# Patient Record
Sex: Female | Born: 1965
Health system: Southern US, Community
[De-identification: ages and names within clinical notes are randomized; demographics above are authoritative.]

---

## 2019-11-06 ENCOUNTER — Other Ambulatory Visit (HOSPITAL_COMMUNITY): Payer: Medicare Other

## 2019-11-06 ENCOUNTER — Inpatient Hospital Stay
Admission: RE | Admit: 2019-11-06 | Discharge: 2019-12-04 | Disposition: A | Discharge status: Rehabilitation Facility | Payer: Medicare Other | Attending: Internal Medicine | Admitting: Internal Medicine

## 2019-11-06 DIAGNOSIS — J45909 Unspecified asthma, uncomplicated: Secondary | ICD-10-CM

## 2019-11-06 DIAGNOSIS — J9690 Respiratory failure, unspecified, unspecified whether with hypoxia or hypercapnia: Secondary | ICD-10-CM

## 2019-11-07 LAB — COMPREHENSIVE METABOLIC PANEL
ALT: 22 U/L (ref 0–44)
AST: 17 U/L (ref 15–41)
Albumin: 2.2 g/dL — ABNORMAL LOW (ref 3.5–5.0)
Alkaline Phosphatase: 58 U/L (ref 38–126)
Anion gap: 12 (ref 5–15)
BUN: 22 mg/dL — ABNORMAL HIGH (ref 6–20)
CO2: 27 mmol/L (ref 22–32)
Calcium: 8.9 mg/dL (ref 8.9–10.3)
Chloride: 105 mmol/L (ref 98–111)
Creatinine, Ser: 0.68 mg/dL (ref 0.44–1.00)
GFR calc Af Amer: >60 mL/min (ref >60)
GFR calc non Af Amer: >60 mL/min (ref >60)
Glucose, Bld: 82 mg/dL (ref 70–99)
Potassium: 4.4 mmol/L (ref 3.5–5.1)
Sodium: 144 mmol/L (ref 135–145)
Total Bilirubin: 0.5 mg/dL (ref 0.3–1.2)
Total Protein: 5.6 g/dL — ABNORMAL LOW (ref 6.5–8.1)

## 2019-11-07 LAB — CBC
HCT: 37.7 % (ref 36.0–46.0)
Hemoglobin: 11.6 g/dL — ABNORMAL LOW (ref 12.0–15.0)
MCH: 27 pg (ref 26.0–34.0)
MCHC: 30.8 g/dL (ref 30.0–36.0)
MCV: 87.9 fL (ref 80.0–100.0)
Platelets: 202 10*3/uL (ref 150–400)
RBC: 4.29 MIL/uL (ref 3.87–5.11)
RDW: 14.2 % (ref 11.5–15.5)
WBC: 13.2 10*3/uL — ABNORMAL HIGH (ref 4.0–10.5)
nRBC: 0.2 % (ref 0.0–0.2)

## 2019-11-07 LAB — URINALYSIS, ROUTINE W REFLEX MICROSCOPIC
Bilirubin Urine: NEGATIVE
Glucose, UA: NEGATIVE mg/dL
Ketones, ur: NEGATIVE mg/dL
Leukocytes,Ua: NEGATIVE
Nitrite: POSITIVE — AB
Protein, ur: NEGATIVE mg/dL
Specific Gravity, Urine: 1.021 (ref 1.005–1.030)
pH: 6 (ref 5.0–8.0)

## 2019-11-07 LAB — TSH: TSH: 0.488 u[IU]/mL (ref 0.350–4.500)

## 2019-11-10 ENCOUNTER — Other Ambulatory Visit (HOSPITAL_COMMUNITY): Payer: Medicare Other

## 2019-11-11 LAB — BASIC METABOLIC PANEL
Anion gap: 9 (ref 5–15)
BUN: 16 mg/dL (ref 6–20)
CO2: 31 mmol/L (ref 22–32)
Calcium: 8.7 mg/dL — ABNORMAL LOW (ref 8.9–10.3)
Chloride: 103 mmol/L (ref 98–111)
Creatinine, Ser: 0.61 mg/dL (ref 0.44–1.00)
GFR calc Af Amer: >60 mL/min (ref >60)
GFR calc non Af Amer: >60 mL/min (ref >60)
Glucose, Bld: 150 mg/dL — ABNORMAL HIGH (ref 70–99)
Potassium: 4.3 mmol/L (ref 3.5–5.1)
Sodium: 143 mmol/L (ref 135–145)

## 2019-11-11 LAB — CBC
HCT: 37.9 % (ref 36.0–46.0)
Hemoglobin: 11.8 g/dL — ABNORMAL LOW (ref 12.0–15.0)
MCH: 27.3 pg (ref 26.0–34.0)
MCHC: 31.1 g/dL (ref 30.0–36.0)
MCV: 87.7 fL (ref 80.0–100.0)
Platelets: 245 10*3/uL (ref 150–400)
RBC: 4.32 MIL/uL (ref 3.87–5.11)
RDW: 14.8 % (ref 11.5–15.5)
WBC: 14.6 10*3/uL — ABNORMAL HIGH (ref 4.0–10.5)
nRBC: 0 % (ref 0.0–0.2)

## 2019-11-14 LAB — CBC
HCT: 40.6 % (ref 36.0–46.0)
Hemoglobin: 12.8 g/dL (ref 12.0–15.0)
MCH: 28.3 pg (ref 26.0–34.0)
MCHC: 31.5 g/dL (ref 30.0–36.0)
MCV: 89.6 fL (ref 80.0–100.0)
Platelets: 253 10*3/uL (ref 150–400)
RBC: 4.53 MIL/uL (ref 3.87–5.11)
RDW: 15.8 % — ABNORMAL HIGH (ref 11.5–15.5)
WBC: 14.3 10*3/uL — ABNORMAL HIGH (ref 4.0–10.5)
nRBC: 0 % (ref 0.0–0.2)

## 2019-11-14 LAB — BASIC METABOLIC PANEL
Anion gap: 12 (ref 5–15)
BUN: 25 mg/dL — ABNORMAL HIGH (ref 6–20)
CO2: 26 mmol/L (ref 22–32)
Calcium: 9.1 mg/dL (ref 8.9–10.3)
Chloride: 100 mmol/L (ref 98–111)
Creatinine, Ser: 0.69 mg/dL (ref 0.44–1.00)
GFR calc Af Amer: >60 mL/min (ref >60)
GFR calc non Af Amer: >60 mL/min (ref >60)
Glucose, Bld: 197 mg/dL — ABNORMAL HIGH (ref 70–99)
Potassium: 4.3 mmol/L (ref 3.5–5.1)
Sodium: 138 mmol/L (ref 135–145)

## 2019-11-19 ENCOUNTER — Other Ambulatory Visit (HOSPITAL_COMMUNITY): Payer: Medicare Other

## 2019-11-19 LAB — COMPREHENSIVE METABOLIC PANEL
ALT: 38 U/L (ref 0–44)
AST: 24 U/L (ref 15–41)
Albumin: 2.7 g/dL — ABNORMAL LOW (ref 3.5–5.0)
Alkaline Phosphatase: 53 U/L (ref 38–126)
Anion gap: 12 (ref 5–15)
BUN: 19 mg/dL (ref 6–20)
CO2: 24 mmol/L (ref 22–32)
Calcium: 8.6 mg/dL — ABNORMAL LOW (ref 8.9–10.3)
Chloride: 104 mmol/L (ref 98–111)
Creatinine, Ser: 0.54 mg/dL (ref 0.44–1.00)
GFR calc Af Amer: >60 mL/min (ref >60)
GFR calc non Af Amer: >60 mL/min (ref >60)
Glucose, Bld: 179 mg/dL — ABNORMAL HIGH (ref 70–99)
Potassium: 3.7 mmol/L (ref 3.5–5.1)
Sodium: 140 mmol/L (ref 135–145)
Total Bilirubin: 0.8 mg/dL (ref 0.3–1.2)
Total Protein: 5.6 g/dL — ABNORMAL LOW (ref 6.5–8.1)

## 2019-11-19 LAB — CBC
HCT: 39.9 % (ref 36.0–46.0)
Hemoglobin: 12.9 g/dL (ref 12.0–15.0)
MCH: 29.4 pg (ref 26.0–34.0)
MCHC: 32.3 g/dL (ref 30.0–36.0)
MCV: 90.9 fL (ref 80.0–100.0)
Platelets: 282 10*3/uL (ref 150–400)
RBC: 4.39 MIL/uL (ref 3.87–5.11)
RDW: 17 % — ABNORMAL HIGH (ref 11.5–15.5)
WBC: 12.7 10*3/uL — ABNORMAL HIGH (ref 4.0–10.5)
nRBC: 0 % (ref 0.0–0.2)

## 2019-11-19 LAB — URINALYSIS, ROUTINE W REFLEX MICROSCOPIC
Bilirubin Urine: NEGATIVE
Glucose, UA: NEGATIVE mg/dL
Hgb urine dipstick: NEGATIVE
Ketones, ur: NEGATIVE mg/dL
Leukocytes,Ua: NEGATIVE
Nitrite: NEGATIVE
Protein, ur: NEGATIVE mg/dL
Specific Gravity, Urine: 1.031 — ABNORMAL HIGH (ref 1.005–1.030)
pH: 5 (ref 5.0–8.0)

## 2019-11-21 LAB — URINE CULTURE: Culture: <10000 — AB

## 2019-11-24 LAB — CBC
HCT: 38.7 % (ref 36.0–46.0)
Hemoglobin: 12.6 g/dL (ref 12.0–15.0)
MCH: 29 pg (ref 26.0–34.0)
MCHC: 32.6 g/dL (ref 30.0–36.0)
MCV: 89.2 fL (ref 80.0–100.0)
Platelets: 258 10*3/uL (ref 150–400)
RBC: 4.34 MIL/uL (ref 3.87–5.11)
RDW: 17.8 % — ABNORMAL HIGH (ref 11.5–15.5)
WBC: 18 10*3/uL — ABNORMAL HIGH (ref 4.0–10.5)
nRBC: 0 % (ref 0.0–0.2)

## 2019-11-24 LAB — BASIC METABOLIC PANEL
Anion gap: 10 (ref 5–15)
BUN: 24 mg/dL — ABNORMAL HIGH (ref 6–20)
CO2: 34 mmol/L — ABNORMAL HIGH (ref 22–32)
Calcium: 9 mg/dL (ref 8.9–10.3)
Chloride: 102 mmol/L (ref 98–111)
Creatinine, Ser: 0.57 mg/dL (ref 0.44–1.00)
GFR calc Af Amer: >60 mL/min (ref >60)
GFR calc non Af Amer: >60 mL/min (ref >60)
Glucose, Bld: 137 mg/dL — ABNORMAL HIGH (ref 70–99)
Potassium: 3.8 mmol/L (ref 3.5–5.1)
Sodium: 146 mmol/L — ABNORMAL HIGH (ref 135–145)

## 2019-11-26 LAB — BASIC METABOLIC PANEL
Anion gap: 11 (ref 5–15)
BUN: 25 mg/dL — ABNORMAL HIGH (ref 6–20)
CO2: 33 mmol/L — ABNORMAL HIGH (ref 22–32)
Calcium: 8.8 mg/dL — ABNORMAL LOW (ref 8.9–10.3)
Chloride: 100 mmol/L (ref 98–111)
Creatinine, Ser: 0.59 mg/dL (ref 0.44–1.00)
GFR calc Af Amer: >60 mL/min (ref >60)
GFR calc non Af Amer: >60 mL/min (ref >60)
Glucose, Bld: 149 mg/dL — ABNORMAL HIGH (ref 70–99)
Potassium: 3.7 mmol/L (ref 3.5–5.1)
Sodium: 144 mmol/L (ref 135–145)

## 2019-11-26 LAB — CBC
HCT: 37.8 % (ref 36.0–46.0)
Hemoglobin: 11.9 g/dL — ABNORMAL LOW (ref 12.0–15.0)
MCH: 28.1 pg (ref 26.0–34.0)
MCHC: 31.5 g/dL (ref 30.0–36.0)
MCV: 89.4 fL (ref 80.0–100.0)
Platelets: 209 10*3/uL (ref 150–400)
RBC: 4.23 MIL/uL (ref 3.87–5.11)
RDW: 18.2 % — ABNORMAL HIGH (ref 11.5–15.5)
WBC: 16.3 10*3/uL — ABNORMAL HIGH (ref 4.0–10.5)
nRBC: 0 % (ref 0.0–0.2)

## 2019-11-28 LAB — TSH: TSH: 0.014 u[IU]/mL — ABNORMAL LOW (ref 0.350–4.500)

## 2019-11-29 ENCOUNTER — Other Ambulatory Visit (HOSPITAL_COMMUNITY): Payer: Medicare Other

## 2019-11-29 ENCOUNTER — Ambulatory Visit (HOSPITAL_COMMUNITY): Payer: Medicare Other

## 2019-11-29 LAB — T4, FREE: Free T4: 1.38 ng/dL — ABNORMAL HIGH (ref 0.61–1.12)

## 2019-11-29 MED ORDER — PERFLUTREN LIPID MICROSPHERE
1.0000 mL | INTRAVENOUS | Status: AC | PRN
  Administered 2019-11-29: 2 mL via INTRAVENOUS

## 2019-11-29 NOTE — Progress Notes (Signed)
  Echocardiogram 2D Echocardiogram has been performed.  Bonnie Ballard 11/29/2019, 2:29 PM

## 2019-11-30 LAB — T3 UPTAKE: T3 Uptake Ratio: 31 % (ref 24–39)

## 2019-12-01 LAB — BASIC METABOLIC PANEL
Anion gap: 11 (ref 5–15)
BUN: 21 mg/dL — ABNORMAL HIGH (ref 6–20)
CO2: 32 mmol/L (ref 22–32)
Calcium: 8.9 mg/dL (ref 8.9–10.3)
Chloride: 100 mmol/L (ref 98–111)
Creatinine, Ser: 0.6 mg/dL (ref 0.44–1.00)
GFR calc Af Amer: >60 mL/min (ref >60)
GFR calc non Af Amer: >60 mL/min (ref >60)
Glucose, Bld: 146 mg/dL — ABNORMAL HIGH (ref 70–99)
Potassium: 4 mmol/L (ref 3.5–5.1)
Sodium: 143 mmol/L (ref 135–145)

## 2019-12-03 LAB — NOVEL CORONAVIRUS, NAA (HOSP ORDER, SEND-OUT TO REF LAB; TAT 18-24 HRS): SARS-CoV-2, NAA: NOT DETECTED

## 2019-12-03 NOTE — PMR Pre-admission (Signed)
PMR Admission Coordinator Pre-Admission Assessment   Patient: Bonnie Ballard is an 54 y.o., female MRN: [id number] DOB: 12/06/1965 Height: 5' 6" (1.676 m) Weight: 109.9 kg   Insurance Information HMO:     PPO:      PCP:      IPA:      80/20:      OTHER:  PRIMARY: Medicare A and B      Policy#: [id number]      Subscriber: pt CM Name:      Phone#:      Fax#:  Pre-Cert#: verified online      Employer:  Benefits:  Phone #:      Name:  Eff. Date: 04/19/09 A and B     Deduct: $1484      Out of Pocket Max: n/a      Life Max: n/a CIR: 100%      SNF: 20 full days  Outpatient: 80%     Co-Pay: 20% Home Health: 100%      Co-Pay:  DME: 80%     Co-Pay: 20% Providers:  SECONDARY: Medicaid [place]      Policy#: [id number]      Phone#:    Financial Counselor:       Phone#:    The "Data Collection Information Summary" for patients in Inpatient Rehabilitation Facilities with attached "Privacy Act Statement-Health Care Records" was provided and verbally reviewed with: Patient   Emergency Contact Information         Contact Information     Name Relation Home Work Mobile    MARTINEZ,SUSAN Sister     [phone]    Panjwani,SARA Daughter     [phone]         Current Medical History  Patient Admitting Diagnosis: debility, post-covid    History of Present Illness: Bonnie Ballard is a 54-year-old right-handed female history of gastric bypass 2017, hypertension, sarcoidosis, hyperlipidemia, hypothyroidism, left partial TKA April 2021.  Presented to outside hospital 09/23/2019 ([facility]) with COVID-19 pneumonia, developed ARDS and required BiPAP.  Pt narrowly avoided intubation and was maintained on Solu-Medrol.  She was weaned down to high flow oxygen.  Hospital course complicated by bilateral DVTs and chest CT negative for pulmonary emboli.  She was placed on Eliquis for DVT.  Hospital course bouts of insomnia maintained on melatonin as well as anxiety with Klonopin 3 times daily.  She was transferred to [facility] on 11/06/2019 for ongoing O2 wean and is currently maintained on 5L via nasal cannula, up to 7L via nasal cannula with activity.  She is tolerating a regular diet.  Therapy ongoing and recommendations were made for CIR.      Patient's medical record from [facility] has been reviewed by the rehabilitation admission coordinator and physician.   Past Medical History  No past medical history on file.   Family History   family history is not on file.   Prior Rehab/Hospitalizations Has the patient had prior rehab or hospitalizations prior to admission? Yes   Has the patient had major surgery during 100 days prior to admission? Yes (TKR)               Current Medications No current facility-administered medications for this encounter.   Patients Current Diet: Diet reg/thin   Precautions / Restrictions Precautions: Fall;Other (comment) (watch O2)     Has the patient had 2 or more falls or a fall with injury in the past year? No   Prior Activity Level   Limited Community (1-2x/wk): on disability, but caring for her grandkids (10 months and 2 y/o) during the days, no DME used immediately prior to admit (had been using a RW earlier this year post TKA), was doing pool therapy multiple times a week, kayaking for fun     Prior Functional Level Self Care: Did the patient need help bathing, dressing, using the toilet or eating? Independent   Indoor Mobility: Did the patient need assistance with walking from room to room (with or without device)? Independent   Stairs: Did the patient need assistance with internal or external stairs (with or without device)? Independent   Functional Cognition: Did the patient need help planning regular tasks such as shopping or remembering to take medications? Independent   Home Assistive Devices / Equipment Cane (specify quad or straight);Walker (specify type)     Prior Device Use: Indicate devices/aids used by the patient  prior to current illness, exacerbation or injury? None of the above     Prior Functional Level Current Functional Level  Bed Mobility   Independent    Transfers   Independent      Mobility - Walk/Wheelchair   Independent      Upper Body Dressing   Independent   Min assist    Lower Body Dressing   Independent   Max assist    Grooming   Independent      Eating/Drinking   Independent      Toilet Transfer   Independent   Max assist (min assist slide board, max squat/pivot)    Bladder Continence    continent   continent    Bowel Management   continent   continent    Stair Climbing   Independent      Communication   no difficulty   no difficulty    Memory   no difficulty no difficulty      Special Needs/ Care Considerations Oxygen 5L-7L, Bowel management continent and Bladder management continent   Previous Home Environment (from acute therapy documentation) Living Arrangements: Children;Spouse/significant other Available Help at Discharge: Family;Available 24 hours/day Type of Home: House Home Layout: Able to live on main level with bedroom/bathroom Home Access: Stairs to enter Entrance Stairs-Rails: Left Entrance Stairs-Number of Steps: 2 Bathroom Shower/Tub: Tub/shower unit Bathroom Toilet: Standard Bathroom Accessibility: Yes How Accessible: Accessible via walker Home Care Services: No Additional Comments: just completed rehab for TKA prior to hospitalization     Discharge Living Setting Plans for Discharge Living Setting: Patient's home Type of Home at Discharge: House Discharge Home Layout: Able to live on main level with bedroom/bathroom Discharge Home Access: Stairs to enter Entrance Stairs-Rails: Left Entrance Stairs-Number of Steps: 2 Discharge Bathroom Shower/Tub: Tub/shower unit Discharge Bathroom Toilet: Standard Discharge Bathroom Accessibility: Yes How Accessible: Accessible via walker     Social/Family/Support Systems Patient  Roles: Caregiver Anticipated Caregiver: daughters Anticipated Caregiver's Contact Information: Susan Martinez, Susan Lauter Ability/Limitations of Caregiver: Martinez [phone], Reinig [phone] Caregiver Availability: 24/7 Discharge Plan Discussed with Primary Caregiver: Yes Is Caregiver In Agreement with Plan?: Yes Does Caregiver/Family have Issues with Lodging/Transportation while Pt is in Rehab?: No     Goals Patient/Family Goal for Rehab: PT/OT sueprvision to mod I Expected length of stay: 18-21 days Pt/Family Agrees to Admission and willing to participate: Yes Program Orientation Provided & Reviewed with Pt/Caregiver Including Roles  & Responsibilities: Yes     Decrease burden of Care through IP rehab admission: n/a   Possible need for SNF placement upon discharge: Not anticipated      Patient Condition: I have reviewed medical records from [facility], spoken with CM, and patient. I met with patient at the bedside for inpatient rehabilitation assessment.  Patient will benefit from ongoing PT and OT, can actively participate in 3 hours of therapy a day 5 days of the week, and can make measurable gains during the admission.  Patient will also benefit from the coordinated team approach during an Inpatient Acute Rehabilitation admission.  The patient will receive intensive therapy as well as Rehabilitation physician, nursing, social worker, and care management interventions.  Due to safety, disease management, medication administration, pain management and patient education the patient requires 24 hour a day rehabilitation nursing.  The patient is currently min to max +1 with mobility and basic ADLs.  Discharge setting and therapy post discharge at home is anticipated.  Patient has agreed to participate in the Acute Inpatient Rehabilitation Program and will admit today.   Preadmission Screen Completed By:  Caitlin E Warren, PT, DPT 12/04/2019 10:40  AM ______________________________________________________________________   Discussed status with Dr. Zyra Parrillo on 12/04/19  at 10:40 AM  and received approval for admission today.   Admission Coordinator:  Caitlin E Warren, PT, DPT time 10:40 AM /Date 12/04/19     Assessment/Plan: Diagnosis: 1. Does the need for close, 24 hr/day Medical supervision in concert with the patient's rehab needs make it unreasonable for this patient to be served in a less intensive setting? Yes 2. Co-Morbidities requiring supervision/potential complications: sarcoidosis, COIVID, ARDS/biPAP, B/L DVTs, On O2 3. Due to bowel management, safety, skin/wound care, disease management, medication administration, pain management and patient education, does the patient require 24 hr/day rehab nursing? Yes 4. Does the patient require coordinated care of a physician, rehab nurse, PT, OT, and SLP to address physical and functional deficits in the context of the above medical diagnosis(es)? Yes Addressing deficits in the following areas: balance, endurance, locomotion, strength, transferring, bathing, dressing, feeding, grooming and toileting 5. Can the patient actively participate in an intensive therapy program of at least 3 hrs of therapy 5 days a week? Yes 6. The potential for patient to make measurable gains while on inpatient rehab is good 7. Anticipated functional outcomes upon discharge from inpatient rehab: modified independent and supervision PT, modified independent and supervision OT, n/a SLP 8. Estimated rehab length of stay to reach the above functional goals is: 18-21 days 9. Anticipated discharge destination: Home 10. Overall Rehab/Functional Prognosis: good   MD Signature  

## 2019-12-04 ENCOUNTER — Inpatient Hospital Stay (HOSPITAL_COMMUNITY)
Admission: RE | Admit: 2019-12-04 | Discharge: 2019-12-28 | DRG: 092 | Disposition: A | Discharge status: Home Health | Payer: Medicare Other | Source: Other Acute Inpatient Hospital | Attending: Physical Medicine & Rehabilitation | Admitting: Physical Medicine & Rehabilitation

## 2019-12-04 ENCOUNTER — Other Ambulatory Visit: Payer: Self-pay

## 2019-12-04 DIAGNOSIS — E785 Hyperlipidemia, unspecified: Secondary | ICD-10-CM | POA: Diagnosis present

## 2019-12-04 DIAGNOSIS — G4700 Insomnia, unspecified: Secondary | ICD-10-CM | POA: Diagnosis present

## 2019-12-04 DIAGNOSIS — R5381 Other malaise: Secondary | ICD-10-CM | POA: Diagnosis present

## 2019-12-04 DIAGNOSIS — K5900 Constipation, unspecified: Secondary | ICD-10-CM | POA: Diagnosis present

## 2019-12-04 DIAGNOSIS — B948 Sequelae of other specified infectious and parasitic diseases: Secondary | ICD-10-CM

## 2019-12-04 DIAGNOSIS — U071 COVID-19: Secondary | ICD-10-CM | POA: Diagnosis present

## 2019-12-04 DIAGNOSIS — J019 Acute sinusitis, unspecified: Secondary | ICD-10-CM | POA: Diagnosis not present

## 2019-12-04 DIAGNOSIS — J1282 Pneumonia due to coronavirus disease 2019: Secondary | ICD-10-CM | POA: Diagnosis not present

## 2019-12-04 DIAGNOSIS — E039 Hypothyroidism, unspecified: Secondary | ICD-10-CM | POA: Diagnosis present

## 2019-12-04 DIAGNOSIS — Z9884 Bariatric surgery status: Secondary | ICD-10-CM | POA: Diagnosis not present

## 2019-12-04 DIAGNOSIS — Z79899 Other long term (current) drug therapy: Secondary | ICD-10-CM | POA: Diagnosis not present

## 2019-12-04 DIAGNOSIS — I10 Essential (primary) hypertension: Secondary | ICD-10-CM | POA: Diagnosis present

## 2019-12-04 DIAGNOSIS — R32 Unspecified urinary incontinence: Secondary | ICD-10-CM | POA: Diagnosis present

## 2019-12-04 DIAGNOSIS — D869 Sarcoidosis, unspecified: Secondary | ICD-10-CM | POA: Diagnosis present

## 2019-12-04 DIAGNOSIS — Z96652 Presence of left artificial knee joint: Secondary | ICD-10-CM | POA: Diagnosis present

## 2019-12-04 DIAGNOSIS — I82403 Acute embolism and thrombosis of unspecified deep veins of lower extremity, bilateral: Secondary | ICD-10-CM | POA: Diagnosis present

## 2019-12-04 DIAGNOSIS — G7281 Critical illness myopathy: Principal | ICD-10-CM | POA: Diagnosis present

## 2019-12-04 DIAGNOSIS — F410 Panic disorder [episodic paroxysmal anxiety] without agoraphobia: Secondary | ICD-10-CM | POA: Diagnosis present

## 2019-12-04 DIAGNOSIS — F418 Other specified anxiety disorders: Secondary | ICD-10-CM | POA: Diagnosis not present

## 2019-12-04 DIAGNOSIS — R059 Cough, unspecified: Secondary | ICD-10-CM

## 2019-12-04 MED ORDER — PREDNISONE 20 MG PO TABS
40.0000 mg | ORAL_TABLET | Freq: Two times a day (BID) | ORAL | Status: DC
  Administered 2019-12-04 – 2019-12-08 (×9): 40 mg via ORAL
  Filled 2019-12-04 (×9): qty 2

## 2019-12-04 MED ORDER — CLONAZEPAM 0.5 MG PO TABS
0.5000 mg | ORAL_TABLET | Freq: Three times a day (TID) | ORAL | Status: DC
  Administered 2019-12-04 – 2019-12-26 (×67): 0.5 mg via ORAL
  Filled 2019-12-04 (×67): qty 1

## 2019-12-04 MED ORDER — APIXABAN 5 MG PO TABS
5.0000 mg | ORAL_TABLET | Freq: Two times a day (BID) | ORAL | Status: DC
  Administered 2019-12-04 – 2019-12-28 (×48): 5 mg via ORAL
  Filled 2019-12-04 (×48): qty 1

## 2019-12-04 MED ORDER — PANTOPRAZOLE SODIUM 40 MG PO TBEC
40.0000 mg | DELAYED_RELEASE_TABLET | Freq: Every day | ORAL | Status: DC
  Administered 2019-12-05 – 2019-12-28 (×24): 40 mg via ORAL
  Filled 2019-12-04 (×24): qty 1

## 2019-12-04 MED ORDER — MELATONIN 5 MG PO TABS
5.0000 mg | ORAL_TABLET | Freq: Every day | ORAL | Status: DC
  Administered 2019-12-04 – 2019-12-27 (×24): 5 mg via ORAL
  Filled 2019-12-04 (×24): qty 1

## 2019-12-04 MED ORDER — MONTELUKAST SODIUM 10 MG PO TABS
10.0000 mg | ORAL_TABLET | Freq: Every day | ORAL | Status: DC
  Administered 2019-12-04 – 2019-12-27 (×24): 10 mg via ORAL
  Filled 2019-12-04 (×24): qty 1

## 2019-12-04 MED ORDER — FUROSEMIDE 40 MG PO TABS
40.0000 mg | ORAL_TABLET | Freq: Every day | ORAL | Status: DC
  Administered 2019-12-05 – 2019-12-28 (×24): 40 mg via ORAL
  Filled 2019-12-04 (×24): qty 1

## 2019-12-04 MED ORDER — TAB-A-VITE/IRON PO TABS
1.0000 | ORAL_TABLET | Freq: Every day | ORAL | Status: DC
  Administered 2019-12-05 – 2019-12-28 (×24): 1 via ORAL
  Filled 2019-12-04 (×24): qty 1

## 2019-12-04 MED ORDER — LEVOTHYROXINE SODIUM 25 MCG PO TABS
25.0000 ug | ORAL_TABLET | ORAL | Status: DC
  Administered 2019-12-05 – 2019-12-28 (×9): 25 ug via ORAL
  Filled 2019-12-04 (×9): qty 1

## 2019-12-04 MED ORDER — METOPROLOL TARTRATE 50 MG PO TABS
50.0000 mg | ORAL_TABLET | Freq: Two times a day (BID) | ORAL | Status: DC
  Administered 2019-12-04 – 2019-12-28 (×47): 50 mg via ORAL
  Filled 2019-12-04 (×48): qty 1

## 2019-12-04 MED ORDER — ATORVASTATIN CALCIUM 40 MG PO TABS
40.0000 mg | ORAL_TABLET | Freq: Every day | ORAL | Status: DC
  Administered 2019-12-04 – 2019-12-27 (×24): 40 mg via ORAL
  Filled 2019-12-04 (×24): qty 1

## 2019-12-04 MED ORDER — HYDROCODONE-ACETAMINOPHEN 5-325 MG PO TABS
1.0000 | ORAL_TABLET | ORAL | Status: DC | PRN
  Administered 2019-12-05 – 2019-12-28 (×54): 1 via ORAL
  Filled 2019-12-04 (×61): qty 1

## 2019-12-04 MED ORDER — PAROXETINE HCL ER 37.5 MG PO TB24
37.5000 mg | ORAL_TABLET | Freq: Every day | ORAL | Status: DC
  Administered 2019-12-05 – 2019-12-28 (×24): 37.5 mg via ORAL
  Filled 2019-12-04 (×24): qty 1

## 2019-12-04 MED ORDER — POLYETHYLENE GLYCOL 3350 17 G PO PACK
17.0000 g | PACK | Freq: Every day | ORAL | Status: DC
  Administered 2019-12-09: 17 g via ORAL
  Filled 2019-12-04 (×21): qty 1

## 2019-12-04 MED ORDER — SORBITOL 70 % SOLN: 30.0000 mL | Freq: Every day | Status: DC | PRN

## 2019-12-04 MED ORDER — MOMETASONE FURO-FORMOTEROL FUM 200-5 MCG/ACT IN AERO
2.0000 | INHALATION_SPRAY | Freq: Two times a day (BID) | RESPIRATORY_TRACT | Status: DC
  Administered 2019-12-04 – 2019-12-19 (×29): 2 via RESPIRATORY_TRACT
  Filled 2019-12-04: qty 8.8

## 2019-12-04 NOTE — Progress Notes (Signed)
Inpatient Rehabilitation Medication Review by a Pharmacist  A complete drug regimen review was completed for this patient to identify any potential clinically significant medication issues.  Clinically significant medication issues were identified:  no  Check AMION for pharmacist assigned to patient if future medication questions/issues arise during this admission.  Pharmacist comments:   Time spent performing this drug regimen review (minutes):    Zigmond Trela S. Merilynn Finland, PharmD, BCPS Clinical Staff Pharmacist [email or web address] Pasty Spillers 12/04/2019 7:00 PM

## 2019-12-04 NOTE — Progress Notes (Signed)
PMR Admission Coordinator Pre-Admission Assessment   Patient: Bonnie Ballard is an 54 y.o., female MRN: [id number] DOB: 01/08/1966 Height: _0  (1.676 m) Weight: 109.9 kg   Insurance Information HMO:     PPO:      PCP:      IPA:      80/20:      OTHER:  PRIMARY: Medicare A and B      Policy#: [id number]      Subscriber: pt CM Name:      Phone#:      Fax#:  Pre-Cert#: verified online      Employer:  Benefits:  Phone #:      Name:  Eff. Date: 04/19/09 A and B     Deduct: $1484      Out of Pocket Max: n/a      Life Max: n/a CIR: 100%      SNF: 20 full days  Outpatient: 80%     Co-Pay: 20% Home Health: 100%      Co-Pay:  DME: 80%     Co-Pay: 20% Providers:  SECONDARY: Medicaid [place]      Policy#: [id number]      Phone#:    Financial Counselor:       Phone#:    The "Data Collection Information Summary" for patients in Inpatient Rehabilitation Facilities with attached "Privacy Act [place] Records" was provided and verbally reviewed with: Patient   Emergency Contact Information         Contact Information     Name Relation Home Work Mobile    Jay Sister     [phone]    [facility] Daughter     [phone]         Current Medical History  Patient Admitting Diagnosis: debility, post-covid    History of Present Illness: Bonnie Ballard is a 54 year old right-handed female history of gastric bypass 2017, hypertension, sarcoidosis, hyperlipidemia, hypothyroidism, left partial TKA April 2021.  Presented to outside hospital 09/23/2019 [facility]) with COVID-19 pneumonia, developed ARDS and required BiPAP.  Pt narrowly avoided intubation and was maintained on Solu-Medrol.  She was weaned down to high flow oxygen.  Hospital course complicated by bilateral DVTs and chest CT negative for pulmonary emboli.  She was placed on Eliquis for DVT.  Hospital course bouts of insomnia maintained on melatonin as well as anxiety with Klonopin 3 times daily.  She was transferred to [facility] on 11/06/2019 for ongoing O2 wean and is currently maintained on 5L via nasal cannula, up to 7L via nasal cannula with activity.  She is tolerating a regular diet.  Therapy ongoing and recommendations were made for CIR.      Patient's medical record from [facility] has been reviewed by the rehabilitation admission coordinator and physician.   Past Medical History  No past medical history on file.   Family History   family history is not on file.   Prior Rehab/Hospitalizations Has the patient had prior rehab or hospitalizations prior to admission? Yes   Has the patient had major surgery during 100 days prior to admission? Yes (TKR)               Current Medications No current facility-administered medications for this encounter.   Patients Current Diet: Diet reg/thin   Precautions / Restrictions Precautions: Fall;Other (comment) (watch O2)     Has the patient had 2 or more falls or a fall with injury in the past year? No   Prior Activity Level  Limited Community (1-2x/wk): on disability, but caring for her grandkids (10 months and 2 y/o) during the days, no DME used immediately prior to admit (had been using a RW earlier this year post TKA), was doing pool therapy multiple times a week, kayaking for fun     Prior Functional Level Self Care: Did the patient need help bathing, dressing, using the toilet or eating? Independent   Indoor Mobility: Did the patient need assistance with walking from room to room (with or without device)? Independent   Stairs: Did the patient need assistance with internal or external stairs (with or without device)? Independent   Functional Cognition: Did the patient need help planning regular tasks such as shopping or remembering to take medications? [place] / Licensed conveyancer (specify quad or straight);Walker (specify type)     Prior Device Use: Indicate devices/aids used by the patient  prior to current illness, exacerbation or injury? None of the above     Prior Functional Level Current Functional Level  Bed Mobility   Independent    Transfers   Independent      Mobility - Walk/Wheelchair   Independent      Upper Body Dressing   Independent   Min assist    Lower Body Dressing   Independent   Max assist    Grooming   Independent      Eating/Drinking   Independent      Toilet Transfer   Independent   Max assist (min assist slide board, max squat/pivot)    Bladder Continence    continent   continent    Bowel Management   continent   continent    Stair Climbing   Independent      Communication   no difficulty   no difficulty    Memory   no difficulty no difficulty      Special Needs/ Care Considerations Oxygen 5L-7L, Bowel management continent and Bladder management continent   Previous Home Environment (from acute therapy documentation) Living Arrangements: Children;Spouse/significant other Available Help at Discharge: Family;Available 24 hours/day Type of Home: House Home Layout: Able to live on main level with bedroom/bathroom Home Access: Stairs to enter Entrance Stairs-Rails: Left Entrance Stairs-Number of Steps: 2 Bathroom Shower/Tub: Chiropodist: Standard Bathroom Accessibility: Yes How Accessible: Accessible via walker Home Care Services: No Additional Comments: just completed rehab for TKA prior to hospitalization     Discharge Living Setting Plans for Discharge Living Setting: Patient's home Type of Home at Discharge: House Discharge Home Layout: Able to live on main level with bedroom/bathroom Discharge Home Access: Stairs to enter Entrance Stairs-Rails: Left Entrance Stairs-Number of Steps: 2 Discharge Bathroom Shower/Tub: Tub/shower unit Discharge Bathroom Toilet: Standard Discharge Bathroom Accessibility: Yes How Accessible: Accessible via walker     Social/Family/Support Systems Patient  Roles: Caregiver Anticipated Caregiver: daughters Anticipated Caregiver's Contact Information: Ramiyah, Mccl[place]ahan Ability/Limitations of Caregiver: Edsel Petrin [phone] Caregiver Availability: 24/7 Discharge Plan Discussed with Primary Caregiver: Yes Is Caregiver In Agreement with Plan?: Yes Does Caregiver/Family have Issues with Lodging/Transportation while Pt is in Rehab?: No     Goals Patient/Family Goal for Rehab: PT/OT sueprvision to mod I Expected length of stay: 18-21 days Pt/Family Agrees to Admission and willing to participate: Yes Program Orientation Provided & Reviewed with Pt/Caregiver Including Roles  & Responsibilities: Yes     Decrease burden of Care through IP rehab admission: n/a   Possible need for SNF placement upon discharge: Not anticipated  Patient Condition: I have reviewed medical records from [facility], spoken with CM, and patient. I met with patient at the bedside for inpatient rehabilitation assessment.  Patient will benefit from ongoing PT and OT, can actively participate in 3 hours of therapy a day 5 days of the week, and can make measurable gains during the admission.  Patient will also benefit from the coordinated team approach during an Inpatient Acute Rehabilitation admission.  The patient will receive intensive therapy as well as Rehabilitation physician, nursing, social worker, and care management interventions.  Due to safety, disease management, medication administration, pain management and patient education the patient requires 24 hour a day rehabilitation nursing.  The patient is currently min to max +1 with mobility and basic ADLs.  Discharge setting and therapy post discharge at home is anticipated.  Patient has agreed to participate in the Acute Inpatient Rehabilitation Program and will admit today.   Preadmission Screen Completed By:  Michel Santee, PT, DPT 12/04/2019 10:40  AM ______________________________________________________________________   Discussed status with Dr. Dagoberto Ligas on 12/04/19  at 10:40 AM  and received approval for admission today.   Admission Coordinator:  Michel Santee, PT, DPT time 10:40 AM Sudie Grumbling 12/04/19     Assessment/Plan: Diagnosis: 1. Does the need for close, 24 hr/day Medical supervision in concert with the patient's rehab needs make it unreasonable for this patient to be served in a less intensive setting? Yes 2. Co-Morbidities requiring supervision/potential complications: sarcoidosis, COIVID, ARDS/biPAP, B/L DVTs, On O2 3. Due to bowel management, safety, skin/wound care, disease management, medication administration, pain management and patient education, does the patient require 24 hr/day rehab nursing? Yes 4. Does the patient require coordinated care of a physician, rehab nurse, PT, OT, and SLP to address physical and functional deficits in the context of the above medical diagnosis(es)? Yes Addressing deficits in the following areas: balance, endurance, locomotion, strength, transferring, bathing, dressing, feeding, grooming and toileting 5. Can the patient actively participate in an intensive therapy program of at least 3 hrs of therapy 5 days a week? Yes 6. The potential for patient to make measurable gains while on inpatient rehab is good 7. Anticipated functional outcomes upon discharge from inpatient rehab: modified independent and supervision PT, modified independent and supervision OT, n/a SLP 8. Estimated rehab length of stay to reach the above functional goals is: 18-21 days 9. Anticipated discharge destination: Home 10. Overall Rehab/Functional Prognosis: good   MD Signature

## 2019-12-04 NOTE — Progress Notes (Signed)
Patient admitted to 4w16 A&Ox4 no pain. Oriented to unit and fall and visitor policy.

## 2019-12-04 NOTE — H&P (Signed)
.   Physical Medicine and Rehabilitation Admission H&P    HPI: Bonnie Ballard is a 54 year old right-handed female history of gastric bypass 2017, hypertension, sarcoidosis, hyperlipidemia, hypothyroidism, left partial TKA April 2021.  Per chart review she lives with her 2 adult aged daughters in [place].  Daughters assist as needed.  To level home bed and bath on main level 2 steps to entry.  Reportedly independent prior to admission.  Presented to outside hospital 09/23/2019 [facility] with COVID-19 pneumonia developed ARDS requiring BiPAP narrowly avoiding intubation and was maintained on Solu-Medrol.  She was weaned down to high flow oxygen.  Hospital course complicated by bilateral DVTs and chest CT negative for pulmonary emboli.  She was placed on Eliquis for DVT.  Hospital course bouts of insomnia maintained on melatonin as well as anxiety with Klonopin 3 times daily.  She is tolerating a regular diet.  LBM yesterday- urinating well- said was on oxycodone 10/325 mg 4x/day at home due to "8 bulging discs and needs R TKR and L knee still hurts".    Review of Systems  Constitutional: Positive for chills, fever and malaise/fatigue.  HENT: Negative for hearing loss.   Eyes: Negative for blurred vision and double vision.  Respiratory: Positive for cough, shortness of breath and wheezing.   Cardiovascular: Positive for leg swelling. Negative for chest pain and palpitations.  Gastrointestinal: Positive for constipation. Negative for heartburn, nausea and vomiting.  Genitourinary: Negative for dysuria, flank pain and hematuria.  Musculoskeletal: Positive for back pain, joint pain and myalgias.  Skin: Negative for rash.  Psychiatric/Behavioral: Positive for depression. The patient has insomnia.        Anxiety  All other systems reviewed and are negative.  No past medical history on file.  No family history on file. Social History:  has no history on file for tobacco  use, alcohol use, and drug use. Allergies: Not on File No medications prior to admission.    Drug Regimen Review Drug regimen was reviewed and remains appropriate with no significant issues identified  Home:     Functional History: Independent prior to admission living with her 2 daughters.  To level home bed and bath downstairs 2 steps to entry.  Functional Status:  Mobility: Supine to edge of bed with close supervision.  Mod max assist bed mobility with decreased standing tolerance          ADL: Mod max assist ADLs    Cognition:WNL      Physical Exam: There were no vitals taken for this visit. Physical Exam Vitals and nursing note reviewed.  Constitutional:      Comments: Pt laying supine in bed- bed in sitting position; pt BMI 39, NAD- On O2 via [place]  HENT:     Head: Normocephalic and atraumatic.     Comments: Smile equal    Right Ear: External ear normal.     Left Ear: External ear normal.     Nose: Nose normal. No congestion.     Mouth/Throat:     Mouth: Mucous membranes are moist.     Pharynx: Oropharynx is clear. No oropharyngeal exudate.  Eyes:     General:        Right eye: No discharge.        Left eye: No discharge.     Extraocular Movements: Extraocular movements intact.     Conjunctiva/sclera: Conjunctivae normal.  Cardiovascular:     Comments: RRR- rate in 90s on monitor-  Pulmonary:  Comments: Good air movement- coarse breath sounds , but mild throughout- on O2 5L by [place]- sats 91%, but pt said "breathing fine".  Abdominal:     Comments: Soft, NT, ND, (+)BS- protuberant  Genitourinary:    Comments: Has "external catheter"-  Musculoskeletal:     Cervical back: Normal range of motion. No rigidity.     Comments: UEs- 5-/5 in deltoid, biceps, triceps, WE, grip and finger abd B/L LEs- HF 2+/5, KE 3+/5, DF and PF 4+/5 B/L  Skin:    Comments: Midline R upper arm- looks good Scar L knee- no skin breakdown found otherwise  Neurological:      Comments: Patient is alert sitting of edge of bed.  Makes good eye contact with examiner.  Oriented x3 and follows commands.  Fair medical historian.  Localized sensation to light touch less around L knee scar- otherwise intact in all 4 extremities  Psychiatric:     Comments: Slightly anxious- slightly pressure speech    128/60 pulse 88 temperature 98 respirations 20 oxygen saturations 92% on 3 L  Results for orders placed or performed during the hospital encounter of 11/06/19 (from the past 48 hour(s))  Novel Coronavirus, NAA (Hosp order, Send-out to Ref Lab; TAT 18-24 hrs     Status: None   Collection Time: 12/02/19  5:01 PM   Specimen: Nasopharyngeal Swab; Respiratory  Result Value Ref Range   SARS-CoV-2, NAA NOT DETECTED NOT DETECTED    Comment: (NOTE) This nucleic acid amplification test was developed and its performance characteristics determined by [employer]. Nucleic acid amplification tests include RT-PCR and TMA. This test has not been FDA cleared or approved. This test has been authorized by FDA under an Emergency Use Authorization (EUA). This test is only authorized for the duration of time the declaration that circumstances exist justifying the authorization of the emergency use of in vitro diagnostic tests for detection of SARS-CoV-2 virus and/or diagnosis of COVID-19 infection under section 564(b)(1) of the Act, 21 U.S.C. 016WFU-9(N) (1), unless the authorization is terminated or revoked sooner. When diagnostic testing is negative, the possibility of a false negative result should be considered in the context of a patient's recent exposures and the presence of clinical signs and symptoms consistent with COVID-19. An individual without symptoms of COVID- 19 and who is not shedding SARS-CoV-2  virus would expect to have a negative (not detected) result in this assay. Performed At: [facility] [address] [place], [place] [id number] Jolene Schimke  MD UR:[id number]    Coronavirus Source NASOPHARYNGEAL     Comment: Performed at [facility], [address], [place], [place] [address]   No results found.     Medical Problem List and Plan: 1.  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  Continue prednisone with taper currently at 40 mg twice daily.  Currently with 5 L oxygen nasal cannula  -patient may  shower  -ELOS/Goals:  Mod I to supervision 2-3 weeks 2.  Antithrombotics: -DVT/anticoagulation: Bilateral lower extremity DVTs.  Patient will remain on Eliquis 5 mg twice daily for a minimum of 3 months total  -antiplatelet therapy: N/A 3. Pain Management: Hydrocodone 1 tablet every 6 hours as needed- pt said she took Percocet 10/325 mg QID at home- fyi 4. Mood: Clonazepam 0.5 mg 3 times daily, Paxil 40 mg daily, melatonin 6 mg nightly- will see if possible to wean  -antipsychotic agents: N/A 5. Neuropsych: This patient is capable of making decisions on her own  behalf. 6. Skin/Wound Care: Routine skin checks 7. Fluids/Electrolytes/Nutrition: Routine in and outs with follow-up chemistries 8.  Hypothyroidism.  Continue Synthroid 25 mcg Monday Wednesday Friday 9.  Hypertension.  Lasix 40 mg daily, metoprolol 50 mg twice daily 10.  Hyperlipidemia.  Lipitor 40 mg daily 11.  History of gastric bypass 2017.  Dietary follow-up 12.  History left TKA April 2021.  Weightbearing as tolerated And needs TKR on R knee, per pt. Other issues to affect pain, per pt, has sarcoidosis and "8 bulging discs" in back".   Mcarthur Rossetti. Anguilli, PA-C 12/04/2019   I have personally performed a face to face diagnostic evaluation of this patient and formulated the key components of the plan.  Additionally, I have personally reviewed laboratory data, imaging studies, as well as relevant notes and concur with the physician assistant's documentation above.   The patient's status has not changed from the original H&P.  Any changes in  documentation from the acute care chart have been noted above.     Genice Rouge, MD 12/04/2019

## 2019-12-05 ENCOUNTER — Inpatient Hospital Stay (HOSPITAL_COMMUNITY): Payer: Medicare Other | Admitting: Occupational Therapy

## 2019-12-05 ENCOUNTER — Inpatient Hospital Stay (HOSPITAL_COMMUNITY): Payer: Medicare Other | Admitting: Physical Therapy

## 2019-12-05 DIAGNOSIS — G7281 Critical illness myopathy: Secondary | ICD-10-CM | POA: Diagnosis not present

## 2019-12-05 LAB — CBC WITH DIFFERENTIAL/PLATELET
Abs Immature Granulocytes: 0.58 10*3/uL — ABNORMAL HIGH (ref 0.00–0.07)
Basophils Absolute: 0.1 10*3/uL (ref 0.0–0.1)
Basophils Relative: 0 %
Eosinophils Absolute: 0 10*3/uL (ref 0.0–0.5)
Eosinophils Relative: 0 %
HCT: 42.2 % (ref 36.0–46.0)
Hemoglobin: 13.4 g/dL (ref 12.0–15.0)
Immature Granulocytes: 3 %
Lymphocytes Relative: 9 %
Lymphs Abs: 1.7 10*3/uL (ref 0.7–4.0)
MCH: 28.6 pg (ref 26.0–34.0)
MCHC: 31.8 g/dL (ref 30.0–36.0)
MCV: 90.2 fL (ref 80.0–100.0)
Monocytes Absolute: 0.7 10*3/uL (ref 0.1–1.0)
Monocytes Relative: 4 %
Neutro Abs: 15 10*3/uL — ABNORMAL HIGH (ref 1.7–7.7)
Neutrophils Relative %: 84 %
Platelets: 165 10*3/uL (ref 150–400)
RBC: 4.68 MIL/uL (ref 3.87–5.11)
RDW: 19.1 % — ABNORMAL HIGH (ref 11.5–15.5)
WBC: 18.1 10*3/uL — ABNORMAL HIGH (ref 4.0–10.5)
nRBC: 0 % (ref 0.0–0.2)

## 2019-12-05 LAB — COMPREHENSIVE METABOLIC PANEL
ALT: 52 U/L — ABNORMAL HIGH (ref 0–44)
AST: 28 U/L (ref 15–41)
Albumin: 2.8 g/dL — ABNORMAL LOW (ref 3.5–5.0)
Alkaline Phosphatase: 44 U/L (ref 38–126)
Anion gap: 9 (ref 5–15)
BUN: 21 mg/dL — ABNORMAL HIGH (ref 6–20)
CO2: 33 mmol/L — ABNORMAL HIGH (ref 22–32)
Calcium: 8.9 mg/dL (ref 8.9–10.3)
Chloride: 99 mmol/L (ref 98–111)
Creatinine, Ser: 0.63 mg/dL (ref 0.44–1.00)
GFR calc Af Amer: >60 mL/min (ref >60)
GFR calc non Af Amer: >60 mL/min (ref >60)
Glucose, Bld: 131 mg/dL — ABNORMAL HIGH (ref 70–99)
Potassium: 4.8 mmol/L (ref 3.5–5.1)
Sodium: 141 mmol/L (ref 135–145)
Total Bilirubin: 0.4 mg/dL (ref 0.3–1.2)
Total Protein: 5.4 g/dL — ABNORMAL LOW (ref 6.5–8.1)

## 2019-12-05 NOTE — Plan of Care (Signed)
Problem: RH Balance Goal: LTG Patient will maintain dynamic sitting balance (PT) Description: LTG:  Patient will maintain dynamic sitting balance with assistance during mobility activities (PT) Flowsheets (Taken 12/05/2019 1310) LTG: Pt will maintain dynamic sitting balance during mobility activities with:: Independent with assistive device  Goal: LTG Patient will maintain dynamic standing balance (PT) Description: LTG:  Patient will maintain dynamic standing balance with assistance during mobility activities (PT) Flowsheets (Taken 12/05/2019 1310) LTG: Pt will maintain dynamic standing balance during mobility activities with:: Minimal Assistance - Patient > 75%   Problem: Sit to Stand Goal: LTG:  Patient will perform sit to stand with assistance level (PT) Description: LTG:  Patient will perform sit to stand with assistance level (PT) Flowsheets (Taken 12/05/2019 1310) LTG: PT will perform sit to stand in preparation for functional mobility with assistance level: Minimal Assistance - Patient > 75%   Problem: RH Bed Mobility Goal: LTG Patient will perform bed mobility with assist (PT) Description: LTG: Patient will perform bed mobility with assistance, with/without cues (PT). Flowsheets (Taken 12/05/2019 1310) LTG: Pt will perform bed mobility with assistance level of: Supervision/Verbal cueing   Problem: RH Bed to Chair Transfers Goal: LTG Patient will perform bed/chair transfers w/assist (PT) Description: LTG: Patient will perform bed to chair transfers with assistance (PT). Flowsheets (Taken 12/05/2019 1310) LTG: Pt will perform Bed to Chair Transfers with assistance level: Minimal Assistance - Patient > 75%   Problem: RH Car Transfers Goal: LTG Patient will perform car transfers with assist (PT) Description: LTG: Patient will perform car transfers with assistance (PT). Flowsheets (Taken 12/05/2019 1310) LTG: Pt will perform car transfers with assist:: Minimal Assistance - Patient >  75%   Problem: RH Ambulation Goal: LTG Patient will ambulate in controlled environment (PT) Description: LTG: Patient will ambulate in a controlled environment, # of feet with assistance (PT). Flowsheets (Taken 12/05/2019 1310) LTG: Pt will ambulate in controlled environ  assist needed:: Minimal Assistance - Patient > 75% LTG: Ambulation distance in controlled environment: 26ft with LRAD Goal: LTG Patient will ambulate in home environment (PT) Description: LTG: Patient will ambulate in home environment, # of feet with assistance (PT). Flowsheets (Taken 12/05/2019 1310) LTG: Pt will ambulate in home environ  assist needed:: Minimal Assistance - Patient > 75% LTG: Ambulation distance in home environment: 33ft with LRAD   Problem: RH Wheelchair Mobility Goal: LTG Patient will propel w/c in controlled environment (PT) Description: LTG: Patient will propel wheelchair in controlled environment, # of feet with assist (PT) Flowsheets (Taken 12/05/2019 1310) LTG: Pt will propel w/c in controlled environ  assist needed:: Independent with assistive device LTG: Propel w/c distance in controlled environment: 132ft Goal: LTG Patient will propel w/c in home environment (PT) Description: LTG: Patient will propel wheelchair in home environment, # of feet with assistance (PT). Flowsheets (Taken 12/05/2019 1310) Distance: wheelchair distance in controlled environment: 150 Goal: LTG Patient will propel w/c in community environment (PT) Description: LTG: Patient will propel wheelchair in community environment, # of feet with assist (PT) Flowsheets (Taken 12/05/2019 1310) LTG: Pt will propel w/c in community environ  assist needed:: Independent with assistive device Distance: wheelchair distance in controlled environment: 150   Problem: RH Stairs Goal: LTG Patient will ambulate up and down stairs w/assist (PT) Description: LTG: Patient will ambulate up and down # of stairs with assistance (PT) Flowsheets  (Taken 12/05/2019 1310) LTG: Pt will ambulate up/down stairs assist needed:: Moderate Assistance - Patient 50 - 74% LTG: Pt will  ambulate up and  down number of stairs: 2steps with Bil rails

## 2019-12-05 NOTE — Progress Notes (Signed)
Patient ID: Bonnie Ballard, female   DOB: 1966-02-18, 54 y.o.   MRN: [id number]   Team Conference Report to Patient/Family  Team Conference discussion was reviewed with the patient and caregiver, including goals, any changes in plan of care and target discharge date.  Patient and caregiver express understanding and are in agreement.  The patient has a target discharge date of  (3 weeks).  [name] 12/05/2019, 2:58 PM

## 2019-12-05 NOTE — Progress Notes (Signed)
Inpatient Rehabilitation Center Individual Statement of Services  Patient Name:  Bonnie Ballard  Date:  12/05/2019  Welcome to the Inpatient Rehabilitation Center.  Our goal is to provide you with an individualized program based on your diagnosis and situation, designed to meet your specific needs.  With this comprehensive rehabilitation program, you will be expected to participate in at least 3 hours of rehabilitation therapies Monday-Friday, with modified therapy programming on the weekends.  Your rehabilitation program will include the following services:  Physical Therapy (PT), Occupational Therapy (OT), Speech Therapy (ST), 24 hour per day rehabilitation nursing, Therapeutic Recreaction (TR), Neuropsychology, Care Coordinator, Rehabilitation Medicine, Nutrition Services, Pharmacy Services and Other  Weekly team conferences will be held on Wednesday to discuss your progress.  Your Inpatient Rehabilitation Care Coordinator will talk with you frequently to get your input and to update you on team discussions.  Team conferences with you and your family in attendance may also be held.  Expected length of stay: 18-21 Days  Overall anticipated outcome: Supervision to MOD I  Depending on your progress and recovery, your program may change. Your Inpatient Rehabilitation Care Coordinator will coordinate services and will keep you informed of any changes. Your Inpatient Rehabilitation Care Coordinator's name and contact numbers are listed  below.  The following services may also be recommended but are not provided by the Inpatient Rehabilitation Center:    Home Health Rehabiltiation Services  Outpatient Rehabilitation Services    Arrangements will be made to provide these services after discharge if needed.  Arrangements include referral to agencies that provide these services.  Your insurance has been verified to be:  Medicare Your primary doctor is:    Pertinent information will be shared with  your doctor and your insurance company.  Inpatient Rehabilitation Care Coordinator:  Lavera Guise, [place] [phone] or [phone]  Information discussed with and copy given to patient by: [name], 12/05/2019, 2:00 PM

## 2019-12-05 NOTE — Evaluation (Signed)
Physical Therapy Assessment and Plan  Patient Details  Name: Bonnie Ballard MRN: [id number] Date of Birth: 27-May-1965  PT Diagnosis: Difficulty walking and Muscle weakness Rehab Potential: Good ELOS: 21-24 days   Today's Date: 12/05/2019 PT Individual Time: 1100-1210 PT Individual Time Calculation (min): 70 min    Hospital Problem: Principal Problem:   Intensive care (ICU) myopathy Active Problems:   Pneumonia due to COVID-19 virus   Critical illness myopathy   Past Medical History: No past medical history on file.  Assessment & Plan Clinical Impression: Patient is a 54 -year-old right-handed female history of gastric bypass 2017, hypertension, sarcoidosis, hyperlipidemia, hypothyroidism, left partial TKA April 2021. Presented to outside hospital 09/23/2019([facility])with COVID-19 pneumonia,developed ARDS and requiredBiPAP. Ptnarrowly avoidedintubation and was maintained on Solu-Medrol. She was weaned down to high flow oxygen. Hospital course complicated by bilateral DVTs and chest CT negative for pulmonary emboli. She was placed on Eliquis for DVT. Hospital course bouts of insomnia maintained on melatonin as well as anxiety with Klonopin 3 times daily.She was transferred to [facility] on 11/06[facility]2019 for ongoing O2 wean and is currently maintained on 5L via nasal cannula, up to 7L via nasal cannula with activity.  Patient transferred to CIR on 12/04/2019 .   Patient currently requires max with mobility secondary to muscle weakness and muscle joint tightness, decreased cardiorespiratoy endurance and decreased oxygen support and decreased sitting balance, decreased standing balance and decreased balance strategies.  Prior to hospitalization, patient was modified independent  with mobility and lived with Daughter, Other (Comment) (2 adult daughters and grandchild) in a House home.  Home access is 2Stairs to enter.  Patient will benefit from skilled PT intervention  to maximize safe functional mobility, minimize fall risk and decrease caregiver burden for planned discharge home with 24 hour supervision.  Anticipate patient will benefit from follow up [place] at discharge.  PT - End of Session Activity Tolerance: Tolerates < 10 min activity with changes in vital signs Endurance Deficit: Yes Endurance Deficit Description: Need for frequent and extended rest breaks PT Assessment Rehab Potential (ACUTE/IP ONLY): Good PT Barriers to Discharge: [place] home environment;Decreased caregiver support;Home environment access/layout;Insurance for SNF coverage;Weight PT Patient demonstrates impairments in the following area(s): Balance;Edema;Endurance;Motor PT Transfers Functional Problem(s): Bed Mobility;Bed to Chair;Car;Furniture;Floor PT Locomotion Functional Problem(s): Ambulation;Wheelchair Mobility;Stairs PT Plan PT Intensity: Minimum of 1-2 x/day ,45 to 90 minutes PT Frequency: 5 out of 7 days PT Duration Estimated Length of Stay: 21-24 days PT Treatment/Interventions: Ambulation/gait training;Community reintegration;DME/adaptive equipment instruction;Neuromuscular re-education;Psychosocial support;Stair training;UE/LE Strength taining/ROM;Wheelchair propulsion/positioning;UE/LE Coordination activities;Skin care/wound management;Therapeutic Activities;Pain management;Discharge planning;Functional electrical stimulation;Balance/vestibular training;Cognitive remediation/compensation;Disease management/prevention;Functional mobility training;Therapeutic Exercise;Splinting/orthotics;Patient/family education;Visual/perceptual remediation/compensation PT Transfers Anticipated Outcome(s): Min assist with LRAD PT Locomotion Anticipated Outcome(s): WC level Mod I. min assist gait for short distances with LRAD PT Recommendation Recommendations for Other Services: Therapeutic Recreation consult;Neuropsych consult Therapeutic Recreation Interventions: Stress  management;Outing/community reintergration Follow Up Recommendations: Home health PT Patient destination: Home Equipment Recommended: Wheelchair (measurements);Wheelchair cushion (measurements);To be determined   PT Evaluation Precautions/Restrictions Precautions Precautions: Fall;Other (comment) Precaution Comments: 50-7L via [place] Restrictions Weight Bearing Restrictions: No General   Vital Signs  5-6L/min 95% with rest. desat to 84% with activity, increased to <30 sec to increase back to 90% Pain Pain Assessment Pain Scale: 0-10 Pain Score: 6  Pain Type: Acute pain Pain Location: Other (Comment) Pain Orientation: Right;Left Pain Descriptors / Indicators: Aching Home Living/Prior Functioning Home Living Available Help at Discharge: Family;Available 24 hours/day Type of Home: House Home Access: Stairs to enter  Entrance Stairs-Number of Steps: 2 Entrance Stairs-Rails: Left Home Layout: Able to live on main level with bedroom/bathroom Bathroom Shower/Tub: Chiropodist: Standard Bathroom Accessibility: Yes Additional Comments: just completed outpatient rehab for TKA prior to hospitalization  Lives With: Daughter;Other (Comment) (2 adult daughters and grandchild) Prior Function Level of Independence: Independent with basic ADLs;Independent with homemaking with ambulation  Able to Take Stairs?: Yes Driving: Yes Vision/Perception  Vision - Assessment Eye Alignment: Within Functional Limits Perception Perception: Within Functional Limits Praxis Praxis: Intact  Cognition Overall Cognitive Status: Within Functional Limits for tasks assessed Orientation Level: Oriented X4 Focused Attention: Appears intact Memory: Appears intact Immediate Memory Recall: Sock;Blue;Bed Memory Recall Sock: Without Cue Memory Recall Blue: Without Cue Memory Recall Bed: Without Cue Awareness: Appears intact Problem Solving: Appears intact Safety/Judgment: Appears  intact Sensation Sensation Light Touch: Appears Intact Additional Comments: mild parathesia in the L knee at TKA insicion Coordination Gross Motor Movements are Fluid and Coordinated: Yes Fine Motor Movements are Fluid and Coordinated: Yes Heel Shin Test: limited ROM L>R from hip flexor weakness Motor  Motor Motor: Other (comment) Motor - Skilled Clinical Observations: critical careness myopathy from prolonged hospitalization   Trunk/Postural Assessment  Cervical Assessment Cervical Assessment: Within Functional Limits Thoracic Assessment Thoracic Assessment: Within Functional Limits Lumbar Assessment Lumbar Assessment: Within Functional Limits Postural Control Postural Control: Within Functional Limits  Balance Balance Balance Assessed: Yes Static Sitting Balance Static Sitting - Balance Support: Feet supported Static Sitting - Level of Assistance: 5: Stand by assistance Dynamic Sitting Balance Dynamic Sitting - Balance Support: Feet supported Dynamic Sitting - Level of Assistance: 5: Stand by assistance Static Standing Balance Static Standing - Balance Support: Bilateral upper extremity supported Static Standing - Level of Assistance: 2: Max assist Static Standing - Comment/# of Minutes: BLE blocked in extension Dynamic Standing Balance Dynamic Standing - Level of Assistance: Not tested (comment) Extremity Assessment  RUE Assessment RUE Assessment: Within Functional Limits General Strength Comments: MMT grossly 4-/5 LUE Assessment LUE Assessment: Within Functional Limits General Strength Comments: MMT grossly 4-/5 RLE Assessment RLE Assessment: Exceptions to [facility] General Strength Comments: proximal to distal grossly 4/5 LLE Assessment LLE Assessment: Exceptions to [facility] General Strength Comments: grosslt 4-/5 proximal to distal  Care Tool Care Tool Bed Mobility Roll left and right activity   Roll left and right assist level: Contact Guard/Touching assist    Sit  to lying activity   Sit to lying assist level: Contact Guard/Touching assist    Lying to sitting edge of bed activity   Lying to sitting edge of bed assist level: Moderate Assistance - Patient 50 - 74%     Care Tool Transfers Sit to stand transfer   Sit to stand assist level: Total Assistance - Patient < 25%    Chair/bed transfer   Chair/bed transfer assist level: Maximal Assistance - Patient 25 - 49%     Toilet transfer Toilet transfer activity did not occur: Safety/medical concerns      Scientist, product/process development transfer activity did not occur: Safety/medical concerns        Care Tool Locomotion Ambulation Ambulation activity did not occur: Safety/medical concerns        Walk 10 feet activity Walk 10 feet activity did not occur: Safety/medical concerns       Walk 50 feet with 2 turns activity Walk 50 feet with 2 turns activity did not occur: Safety/medical concerns      Walk 150 feet activity Walk 150 feet activity did not occur:  Safety/medical concerns      Walk 10 feet on uneven surfaces activity Walk 10 feet on uneven surfaces activity did not occur: Safety/medical concerns      Stairs Stair activity did not occur: Safety/medical concerns        Walk up/down 1 step activity Walk up/down 1 step or curb (drop down) activity did not occur: Safety/medical concerns     Walk up/down 4 steps activity did not occuR: Safety/medical concerns  Walk up/down 4 steps activity      Walk up/down 12 steps activity Walk up/down 12 steps activity did not occur: Safety/medical concerns Walk up/down 12 steps assist level: Moderate Assistance - Patient - 50 - 74%    Pick up small objects from floor Pick up small object from the floor (from standing position) activity did not occur: Refused Pick up small object from the floor assist level: Maximal Assistance - Patient 25 - 49% Pick up small object from the floor assistive device: 150  Wheelchair   Type of Wheelchair: Manual    Wheelchair assist level: Minimal Assistance - Patient > 75%    Wheel 50 feet with 2 turns activity   Assist Level: Minimal Assistance - Patient > 75%  Wheel 150 feet activity   Assist Level: Minimal Assistance - Patient > 75%    Refer to Care Plan for Long Term Goals  SHORT TERM GOAL WEEK 1 PT Short Term Goal 1 (Week 1): Pt will perform bed mobility with min assist consistently PT Short Term Goal 2 (Week 1): Pt will initiate gait training in parallel bars PT Short Term Goal 3 (Week 1): Pt will propell WC 111f with supervision assist PT Short Term Goal 4 (Week 1): pt [facility] and mod assist  Recommendations for other services: Neuropsych and Therapeutic Recreation  Stress management and Outing/community reintegration  Skilled Therapeutic Intervention  Pt received supine in bed and agreeable to PT. Supine>sit transfer with mod assist and as listed above.  PT instructed patient in PT Evaluation and initiated treatment intervention; see below for results. PT educated patient in PMission rehab potential, rehab goals, and discharge recommendations.  [facility] and max cues for improved anterior weight shifting. Sit<>stand in parallel  Bars on 2 attempts with total A. Sit<>stand also perform in bariatric stedy x 2 with max assist  From Pt to power into standing. Patient returned to room and [facility] and all needs met.      Mobility Bed Mobility Bed Mobility: Rolling Right;Rolling Left;Supine to Sit;Sit to Supine Rolling Right: Contact Guard/Touching assist Rolling Left: Contact Guard/Touching assist Supine to Sit: Moderate Assistance - Patient 50-74% Sit to Supine: Moderate Assistance - Patient 50-74% Transfers Transfers: Lateral/Scoot Transfers;Sit to Stand Sit to Stand: Maximal Assistance - Patient 25-49% Stand to Sit: Maximal Assistance - Patient 25-49% Lateral/Scoot Transfers: Maximal Assistance -  Patient 25-49% Transfer (Assistive device): Other (Comment) (slide board) Locomotion  Gait Ambulation: No Gait Gait: No Stairs / Additional Locomotion Stairs: No Wheelchair Mobility Wheelchair Mobility: Yes Wheelchair Assistance: Minimal assistance - Patient >75% Wheelchair Propulsion: Both upper extremities Wheelchair Parts Management: Needs assistance Distance: 1568f  Discharge Criteria: Patient will be discharged from PT if patient refuses treatment 3 consecutive times without medical reason, if treatment goals not met, if there is a change in medical status, if patient makes no progress towards goals or if patient is discharged from hospital.  The above assessment, treatment plan, treatment alternatives and goals were discussed and mutually agreed upon: by patient  Lorie Phenix 12/05/2019, 12:49 PM

## 2019-12-05 NOTE — Patient Care Conference (Signed)
Inpatient RehabilitationTeam Conference and Plan of Care Update Date: 12/05/2019   Time: 10:15 AM    Patient Name: Bonnie Ballard      Medical Record Number: [id number]  Date of Birth: 05-Aug-1965 Sex: Female         Room/Bed: 4W16C/4W16C-01 Payor Info: Payor: MEDICARE / Plan: MEDICARE PART A AND B / Product Type: *No Product type* /    Admit Date/Time:  12/04/2019  3:20 PM  Primary Diagnosis:  Intensive care (ICU) myopathy  Hospital Problems: Principal Problem:   Intensive care (ICU) myopathy Active Problems:   Pneumonia due to COVID-19 virus   Critical illness myopathy    Expected Discharge Date: Expected Discharge Date:  (3 weeks)  Team Members Present: Physician leading conference: Dr. Claudette Laws Care Coodinator Present: Chana Bode, RN, BSN, CRRN;Christina Ubly, BSW Nurse Present: Donna Bernard, LPN PT Present: Grier Rocher, PT SLP Present: Suzzette Righter, CF-SLP PPS Coordinator present : Edson Snowball, PT     Current Status/Progress Goal Weekly Team Focus  Bowel/Bladder   Pt contient of b and b  remain continent of b and b  Assesss q shift and prn   Swallow/Nutrition/ Hydration             ADL's             Mobility   Eval Pending  Eval Pending      Communication             Safety/Cognition/ Behavioral Observations            Pain   pt has no current complaints of pain  remain pain free  Assess q shift and prn   Skin   no current skin breakdown  Remain free of skin breakdown  Assess q shift and prn     Discharge Planning:    TBD  Team Discussion: O2 at 5L/Min [place]; de-sats w activity. Rt. LE pain, anxiety and fatigue limiting participation and progress. . Patient on target to meet rehab goals: Yes; currently +2 A for functional mobility  *See Care Plan and progress notes for long and short-term goals.   Revisions to Treatment Plan:  PT eval pending   Teaching Needs: TBD  Current Barriers to Discharge: Home enviroment access/layout and  Lack of/limited family support  Possible Resolutions to Barriers: Pain management options for leg pain Rest breaks Family education for discharge     Medical Summary Current Status: Blood pressure well controlled, leukocytosis due to steroids, shortness of breath intermittent with history of asthma, occasionally requiring O2  Barriers to Discharge: New oxygen;Other (comments)  Barriers to Discharge Comments: Morbid obesity     Continued Need for Acute Rehabilitation Level of Care: The patient requires daily medical management by a physician with specialized training in physical medicine and rehabilitation for the following reasons: Direction of a multidisciplinary physical rehabilitation program to maximize functional independence : Yes Medical management of patient stability for increased activity during participation in an intensive rehabilitation regime.: Yes Analysis of laboratory values and/or radiology reports with any subsequent need for medication adjustment and/or medical intervention. : Yes   I attest that I was present, lead the team conference, and concur with the assessment and plan of the team.   Chana Bode B 12/05/2019, 3:12 PM

## 2019-12-05 NOTE — Progress Notes (Signed)
Chaplain responded to Spiritual Consult for delivery and explanation of AD.  Chaplain established relationship of care for patient and opened a space for patient to share the story of her miracle cure from Covid.  Afterwards, Chaplain delivered and explained AD to patient. Chaplain offered prayers of celebration and thanksgiving at bedside.  Chaplain available for further visits as needed.  Vernell Morgans Chaplain Resident

## 2019-12-05 NOTE — Progress Notes (Signed)
Inpatient Rehabilitation  Patient information reviewed and entered into eRehab system by Giovanni Bath M. Anaiz Qazi, M.A., CCC/SLP, PPS Coordinator.  Information including medical coding, functional ability and quality indicators will be reviewed and updated through discharge.    

## 2019-12-05 NOTE — Progress Notes (Signed)
[facility] PHYSICAL MEDICINE & REHABILITATION PROGRESS NOTE   Subjective/Complaints:  No issues overnite, DIscussed pain in knees, chronically takes percots 10mg  QID at home, Sees Dr in [place]   ROS- neg CP, SOB, N/V/D  Objective:   No results found. Recent Labs    12/05/19 0608  WBC 18.1*  HGB 13.4  HCT 42.2  PLT 165   Recent Labs    12/05/19 0608  NA 141  K 4.8  CL 99  CO2 33*  GLUCOSE 131*  BUN 21*  CREATININE 0.63  CALCIUM 8.9    Intake/Output Summary (Last 24 hours) at 12/05/2019 12/07/2019 Last data filed at 12/04/2019 2059 Gross per 24 hour  Intake 120 ml  Output --  Net 120 ml     Physical Exam: Vital Signs Blood pressure 111/80, pulse 86, temperature 98 F (36.7 C), temperature source Oral, resp. rate 16, SpO2 92 %.   General: No acute distress Mood and affect are appropriate Heart: Regular rate and rhythm no rubs murmurs or extra sounds Lungs: Clear to auscultation, breathing unlabored, no rales or wheezes Abdomen: Positive bowel sounds, soft nontender to palpation, nondistended Extremities: No clubbing, cyanosis, or edema Skin: No evidence of breakdown, no evidence of rash Neurologic: Cranial nerves II through XII intact, motor strength is 4/5 in bilateral deltoid, bicep, tricep, grip, hip flexor, knee extensors, ankle dorsiflexor and plantar flexor Sensory exam normal sensation to light touch and proprioception in bilateral upper and lower extremities  Musculoskeletal: no pain with UE or LE active ROM  No joint swelling    Assessment/Plan: 1. Functional deficits secondary to CIM which require 3+ hours per day of interdisciplinary therapy in a comprehensive inpatient rehab setting.  Physiatrist is providing close team supervision and 24 hour management of active medical problems listed below.  Physiatrist and rehab team continue to assess barriers to discharge/monitor patient progress toward functional and medical goals  Care  Tool:  Bathing              Bathing assist       Upper Body Dressing/Undressing Upper body dressing        Upper body assist      Lower Body Dressing/Undressing Lower body dressing            Lower body assist       Toileting Toileting    Toileting assist       Transfers Chair/bed transfer  Transfers assist           Locomotion Ambulation   Ambulation assist              Walk 10 feet activity   Assist           Walk 50 feet activity   Assist           Walk 150 feet activity   Assist           Walk 10 feet on uneven surface  activity   Assist           Wheelchair     Assist               Wheelchair 50 feet with 2 turns activity    Assist            Wheelchair 150 feet activity     Assist          Blood pressure 111/80, pulse 86, temperature 98 F (36.7 C), temperature source Oral, resp. rate 16, SpO2 92 %.  Medical Problem List and Plan: 1.  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  Continue prednisone with taper currently at 40 mg twice daily.  Currently with 5 L oxygen nasal cannula             -patient may  shower             -ELOS/Goals:  Mod I to supervision 2-3 weeks CIR PT, OT evals  2.  Antithrombotics: -DVT/anticoagulation: Bilateral lower extremity DVTs.  Patient will remain on Eliquis 5 mg twice daily for a minimum of 3 months total             -antiplatelet therapy: N/A 3. Pain Management: Hydrocodone 1 tablet every 6 hours as needed- pt said she took Percocet 10/325 mg QID at home- Add heat , Kpad 4. Mood: Clonazepam 0.5 mg 3 times daily, Paxil 40 mg daily, melatonin 6 mg nightly- will see if possible to wean             -antipsychotic agents: N/A 5. Neuropsych: This patient is capable of making decisions on her own behalf. 6. Skin/Wound Care: Routine skin checks 7. Fluids/Electrolytes/Nutrition: Routine in and outs with follow-up  chemistries 8.  Hypothyroidism.  Continue Synthroid 25 mcg Monday Wednesday Friday 9.  Hypertension.  Lasix 40 mg daily, metoprolol 50 mg twice daily 10.  Hyperlipidemia.  Lipitor 40 mg daily 11.  History of gastric bypass 2017.  Dietary follow-up 12.  History left TKA April 2021.  Weightbearing as tolerated- left knee actually hurts more than RIght  And needs TKR on R knee, per pt. Other issues to affect pain, per pt, has sarcoidosis and "8 bulging discs" in back".    LOS: 1 days A FACE TO FACE EVALUATION WAS PERFORMED  Erick Colace 12/05/2019, 8:22 AM

## 2019-12-05 NOTE — Progress Notes (Addendum)
Occupational Therapy Session Note  Patient Details  Name: Bonnie Ballard MRN: [id number] Date of Birth: 04/29/1965  Today's Date: 12/05/2019 OT Individual Time: 3668-1594 OT Individual Time Calculation (min): 71 min    Short Term Goals: Week 1:  OT Short Term Goal 1 (Week 1): Patient will complete sit to stand with use of Stedy and Mod A. OT Short Term Goal 2 (Week 1): Patient will complete UB dressing seated EOB with supervision A. OT Short Term Goal 3 (Week 1): Patient will complete LB bathing at bed level with Mod A and use of AE PRN. OT Short Term Goal 4 (Week 1): Patient will complete transfer to [facility] with Mod A and LRAD.  Skilled Therapeutic Interventions/Progress Updates:  Patient met seated in wc in agreement with OT treatment session with focus on self-care re-education, functional transfers, energy conservation, and strategies to manage anxiety including counting backwards, mindfulness and meditation. Patient required Max A for SB transfer to wide drop-arm BSC in bathroom with extended rest breaks. Max A for toileting/hygiene/clothing management seated on commode with lateral leans R<>L. Patient continues to be limited by severely decreased activity tolerance, anxiety, generalized weakness, and need for supplemental O2 via [place] 5-7L.   Therapy Documentation Precautions:  Precautions Precautions: Fall, Other (comment) Precaution Comments: 50-7L via [place] Restrictions Weight Bearing Restrictions: No General:    Therapy/Group: Individual Therapy  Bonnie Ballard 12/05/2019, 2:16 PM

## 2019-12-05 NOTE — Plan of Care (Signed)
  Problem: Consults Goal: RH GENERAL PATIENT EDUCATION Description: See Patient Education module for education specifics. Outcome: Progressing   Problem: RH BOWEL ELIMINATION Goal: RH STG MANAGE BOWEL WITH ASSISTANCE Description: STG Manage Bowel with sup Assistance. Outcome: Progressing   Problem: RH BLADDER ELIMINATION Goal: RH STG MANAGE BLADDER WITH ASSISTANCE Description: STG Manage Bladder With Supv Assistance Outcome: Progressing   Problem: RH SKIN INTEGRITY Goal: RH STG SKIN FREE OF INFECTION/BREAKDOWN Description: Skin will be free of infection/breakdown with min assist Outcome: Progressing Goal: RH STG MAINTAIN SKIN INTEGRITY WITH ASSISTANCE Description: STG Maintain Skin Integrity With min Assistance. Outcome: Progressing   Problem: RH SAFETY Goal: RH STG ADHERE TO SAFETY PRECAUTIONS W/ASSISTANCE/DEVICE Description: STG Adhere to Safety Precautions With MIN Assistance/Device. Outcome: Progressing   Problem: RH PAIN MANAGEMENT Goal: RH STG PAIN MANAGED AT OR BELOW PT'S PAIN GOAL Description: Pain will be managed at 3 out of 10 at MOD I  Outcome: Progressing   Problem: RH KNOWLEDGE DEFICIT GENERAL Goal: RH STG INCREASE KNOWLEDGE OF SELF CARE AFTER HOSPITALIZATION Description: Pt will be able to verbalize self care related to handouts, therapy, and personalization for independent care with min assist  Outcome: Progressing   

## 2019-12-05 NOTE — Evaluation (Signed)
Occupational Therapy Assessment and Plan  Patient Details  Name: Natale Barba MRN: [id number] Date of Birth: [birth year]  OT Diagnosis: muscle weakness (generalized) Rehab Potential: Rehab Potential (ACUTE ONLY): Good ELOS: 21-24 days  Today's Date: 12/05/2019 OT Individual Time: 1314-3888 OT Individual Time Calculation (min): 71 min     Hospital Problem: Principal Problem:   Intensive care (ICU) myopathy Active Problems:   Pneumonia due to COVID-19 virus   Critical illness myopathy   Past Medical History: No past medical history on file.   Assessment & Plan Clinical Impression: Patient is a 54 -year-old right-handed female history of gastric bypass 2017, hypertension, sarcoidosis, hyperlipidemia, hypothyroidism, left partial TKA April 2021. Presented to outside hospital 09/23/2019([facility])with COVID-19 pneumonia,developed ARDS and requiredBiPAP. Ptnarrowly avoidedintubation and was maintained on Solu-Medrol. She was weaned down to high flow oxygen. Hospital course complicated by bilateral DVTs and chest CT negative for pulmonary emboli. She was placed on Eliquis for DVT. Hospital course bouts of insomnia maintained on melatonin as well as anxiety with Klonopin 3 times daily.She was transferred to [facility] on 11/06/2019 for ongoing O2 wean and is currently maintained on 5L via nasal cannula, up to 7L via nasal cannula with activity.  Patient transferred to CIR on 12/04/2019. Patient transferred to CIR on 12/04/2019 .    Patient currently requires by patient with basic self-care skills secondary to muscle weakness and decreased cardiorespiratoy endurance and decreased oxygen support.  Prior to hospitalization, patient could complete BADLs/IADLs independently.  Patient will benefit from skilled intervention to decrease level of assist with basic self-care skills and increase independence with basic self-care skills prior to discharge home with care partner.   Anticipate patient will require 24 hour supervision and follow up home health.  OT - End of Session Activity Tolerance: Tolerates < 10 min activity with changes in vital signs Endurance Deficit: Yes Endurance Deficit Description: Need for frequent and extended rest breaks OT Assessment Rehab Potential (ACUTE ONLY): Good OT Barriers to Discharge: New oxygen OT Patient demonstrates impairments in the following area(s): Balance;Endurance;Skin Integrity OT Basic ADL's Functional Problem(s): Bathing;Dressing;Toileting OT Transfers Functional Problem(s): Toilet;Tub/Shower OT Additional Impairment(s): None OT Plan OT Intensity: Minimum of 1-2 x/day, 45 to 90 minutes OT Frequency: 5 out of 7 days OT Treatment/Interventions: Medical illustrator training;Community reintegration;Discharge planning;DME/adaptive equipment instruction;Functional mobility training;Neuromuscular re-education;Patient/family education;Self Care/advanced ADL retraining;Skin care/wound managment;Therapeutic Activities;Therapeutic Exercise;UE/LE Strength taining/ROM OT Self Feeding Anticipated Outcome(s): Independent OT Basic Self-Care Anticipated Outcome(s): Set-up assist UB, Min A LB OT Toileting Anticipated Outcome(s): Min A OT Bathroom Transfers Anticipated Outcome(s): Min A OT Recommendation Patient destination: Home Follow Up Recommendations: Home health OT;24 hour supervision/assistance   OT Evaluation Precautions/Restrictions Precautions Precautions: Fall;Other (comment) Precaution Comments: 50-7L via [place] Restrictions Weight Bearing Restrictions: No General Vital Signs  5-6L/min 95% with rest. desat to 84-88% with activity returning to >90% after ~15-30sec.  Pain Pain Assessment Pain Scale: 0-10 Pain Score: 6  Pain Type: Acute pain Pain Location: Other (Comment) Pain Orientation: Right;Left Pain Descriptors / Indicators: Aching Home Living/Prior Functioning Home Living Family/patient expects to be  discharged to:: Private residence Living Arrangements: Children, Spouse/significant other Available Help at Discharge: Family, Available 24 hours/day Type of Home: House Home Access: Stairs to enter [employer] of Steps: 2 Entrance Stairs-Rails: Left Home Layout: Able to live on main level with bedroom/bathroom Bathroom Shower/Tub: Chiropodist: Standard Bathroom Accessibility: Yes Additional Comments: just completed outpatient rehab for TKA prior to hospitalization  Lives With: Daughter, Other (Comment) (2 adult daughters and  grandchild) IADL History Homemaking Responsibilities: Yes Meal Prep Responsibility: Primary (Shared responsibility) Laundry Responsibility: Primary (Shared responsibility) Current License: Yes Mode of Transportation: Car, Other (comment) (SUV) Occupation: Other (comment) 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 Leisure and Hobbies: Swimming, [place] IADL Comments: Independent with IADLs without use of AD Prior Function Level of Independence: Independent with basic ADLs, Independent with homemaking with ambulation  Able to Take Stairs?: Yes Driving: Yes Vision Baseline Vision/History: Wears glasses Wears Glasses: At all times Patient Visual Report: No change from baseline Vision Assessment?: Yes Eye Alignment: Within Functional Limits Perception  Perception: Within Functional Limits Praxis Praxis: Intact Cognition Overall Cognitive Status: Within Functional Limits for tasks assessed Orientation Level: Person;Place;Situation Person: Oriented Place: Oriented Situation: Oriented Year: 2021 Month: August Day of Week: Correct Memory: Appears intact Immediate Memory Recall: Sock;Blue;Bed Memory Recall Sock: Without Cue Memory Recall Blue: Without Cue Memory Recall Bed: Without Cue Focused Attention: Appears intact Awareness: Appears intact Problem Solving: Appears intact Safety/Judgment: Appears  intact Sensation Sensation Light Touch: Appears Intact Additional Comments: mild parathesia in the L knee at TKA insicion Coordination Gross Motor Movements are Fluid and Coordinated: Yes Fine Motor Movements are Fluid and Coordinated: Yes Heel Shin Test: limited ROM L>R from hip flexor weakness Motor  Motor Motor: Other (comment) Motor - Skilled Clinical Observations: critical careness myopathy from prolonged hospitalization  Trunk/Postural Assessment  Cervical Assessment Cervical Assessment: Within Functional Limits Thoracic Assessment Thoracic Assessment: Within Functional Limits Lumbar Assessment Lumbar Assessment: Within Functional Limits Postural Control Postural Control: Within Functional Limits  Balance Balance Balance Assessed: Yes Static Sitting Balance Static Sitting - Balance Support: Feet supported Static Sitting - Level of Assistance: 5: Stand by assistance Dynamic Sitting Balance Dynamic Sitting - Balance Support: Feet supported Dynamic Sitting - Level of Assistance: 5: Stand by assistance Static Standing Balance Static Standing - Balance Support: Bilateral upper extremity supported Static Standing - Level of Assistance: 2: Max assist Static Standing - Comment/# of Minutes: BLE blocked in extension Dynamic Standing Balance Dynamic Standing - Level of Assistance: Not tested (comment) Extremity/Trunk Assessment RUE Assessment RUE Assessment: Within Functional Limits General Strength Comments: MMT grossly 4-/5 LUE Assessment LUE Assessment: Within Functional Limits General Strength Comments: MMT grossly 4-/5  Care Tool Care Tool Self Care Eating   Eating Assist Level: Independent    Oral Care    Oral Care Assist Level: Set up assist    Bathing   Body parts bathed by patient: Right arm;Left arm;Chest;Abdomen;Face Body parts bathed by helper: Front perineal area;Buttocks;Right upper leg;Left upper leg;Right lower leg;Left lower leg   Assist Level:  Maximal Assistance - Patient 24 - 49%    Upper Body Dressing(including orthotics)   What is the patient wearing?: Bra;Pull over shirt   Assist Level: Minimal Assistance - Patient > 75%    Lower Body Dressing (excluding footwear)   What is the patient wearing?: Pants;Underwear/pull up Assist for lower body dressing: Maximal Assistance - Patient 25 - 49%    Putting on/Taking off footwear   What is the patient wearing?: [place] for footwear: Maximal Assistance - Patient 25 - 49%       Care Tool Toileting Toileting activity   Assist for toileting: Maximal Assistance - Patient 25 - 49%     Care Tool Bed Mobility Roll left and right activity   Roll left and right assist level: Contact Guard/Touching assist    Sit to lying activity   Sit to lying assist level: Contact Guard/Touching assist  Lying to sitting edge of bed activity   Lying to sitting edge of bed assist level: Moderate Assistance - Patient 50 - 74%     Care Tool Transfers Sit to stand transfer   Sit to stand assist level: Total Assistance - Patient < 25%    Chair/bed transfer   Chair/bed transfer assist level: Maximal Assistance - Patient 25 - 49%     Toilet transfer Toilet transfer activity did not occur: Safety/medical concerns       Care Tool Cognition Expression of Ideas and Wants Expression of Ideas and Wants: Without difficulty (complex and basic) - expresses complex messages without difficulty and with speech that is clear and easy to understand   Understanding Verbal and Non-Verbal Content Understanding Verbal and Non-Verbal Content: Understands (complex and basic) - clear comprehension without cues or repetitions   Memory/Recall Ability *first 3 days only Memory/Recall Ability *first 3 days only: Current season;Location of own room;Staff names and faces;That he or she is in a hospital/hospital unit    Refer to Care Plan for [place] 1 OT Short Term Goal 1 (Week  1): Patient will complete sit to stand with use of Stedy and Mod A. OT Short Term Goal 2 (Week 1): Patient will complete UB dressing seated EOB with supervision A. OT Short Term Goal 3 (Week 1): Patient will complete LB bathing at bed level with Mod A and use of AE PRN. OT Short Term Goal 4 (Week 1): Patient will complete transfer to [facility] with Mod A and LRAD.  Recommendations for other services: Other: TBD   Skilled Therapeutic Intervention Patient met lying supine in bed in agreement with OT evaluation/treatment. Education provided on role of OT, rehab process, ELOS, and goals with patient in agreement. 0/10 pain at rest and with activity. Patient on 5L via [place] at rest with SpO2 88-94%. Patient completed supine to sit EOB with Mod A and attempted lateral scoots to BSC on L but was unsuccessful 2/2 fatigue, SOB, increased anxiety and toilet urgency. Return to supine with Mod A at BLE for toileting with use of bedpan. UB bathing/dressing with Min A in supine and unsupported long sitting in at bed level. Max A for LB bathing/dressing in supine. Session concluded with patient lying supine in bed with call bell within reach, bed alarm activated, and all needs met. Patient would benefit from skilled OT services in CIR to maximize safety and independence with self-care tasks and functional transfers given deficits including severely decreased activity tolerance, generalized weakness, and need for supplemental O2 via [place].   ADL ADL Eating: Independent Where Assessed-Eating: Bed level Grooming: Setup Where Assessed-Grooming: Edge of bed Upper Body Bathing: Minimal assistance Where Assessed-Upper Body Bathing: Bed level Lower Body Bathing: Moderate assistance Where Assessed-Lower Body Bathing: Bed level Upper Body Dressing: Minimal assistance Where Assessed-Upper Body Dressing: Bed level Lower Body Dressing: Maximal assistance Where Assessed-Lower Body Dressing: Bed level Toileting: Maximal  assistance Where Assessed-Toileting: Bed level Toilet Transfer: Maximal assistance Toilet Transfer Method: Other (comment) Charlaine Dalton) Toilet Transfer Equipment: Engineer, technical sales Transfer: Unable to assess Mobility  Bed Mobility Bed Mobility: Rolling Right;Rolling Left;Supine to Sit;Sit to Supine Rolling Right: Contact Guard/Touching assist Rolling Left: Contact Guard/Touching assist Supine to Sit: Moderate Assistance - Patient 50-74% Sit to Supine: Moderate Assistance - Patient 50-74% Transfers Sit to Stand: Maximal Assistance - Patient 25-49% Stand to Sit: Maximal Assistance - Patient 25-49%   Discharge Criteria: Patient will be discharged from OT if  patient refuses treatment 3 consecutive times without medical reason, if treatment goals not met, if there is a change in medical status, if patient makes no progress towards goals or if patient is discharged from hospital.  The above assessment, treatment plan, treatment alternatives and goals were discussed and mutually agreed upon: by patient  Angell Pincock R Howerton-Davis 12/05/2019, 1:02 PM

## 2019-12-06 ENCOUNTER — Inpatient Hospital Stay (HOSPITAL_COMMUNITY): Payer: Medicare Other | Admitting: Physical Therapy

## 2019-12-06 ENCOUNTER — Inpatient Hospital Stay (HOSPITAL_COMMUNITY): Payer: Medicare Other

## 2019-12-06 ENCOUNTER — Inpatient Hospital Stay (HOSPITAL_COMMUNITY): Payer: Medicare Other | Admitting: Occupational Therapy

## 2019-12-06 DIAGNOSIS — G7281 Critical illness myopathy: Secondary | ICD-10-CM | POA: Diagnosis not present

## 2019-12-06 NOTE — Progress Notes (Signed)
Occupational Therapy Session Note  Patient Details  Name: Bonnie Ballard MRN: [id number] Date of Birth: [birth year]  Today's Date: 12/06/2019 OT Individual Time: 7510-2585 OT Individual Time Calculation (min): 60 min   OT Individual Time: 1447-1300 OT Individual Time Calculation (min): 43 min    Short Term Goals: Week 1:  OT Short Term Goal 1 (Week 1): Patient will complete sit to stand with use of Stedy and Mod A. OT Short Term Goal 2 (Week 1): Patient will complete UB dressing seated EOB with supervision A. OT Short Term Goal 3 (Week 1): Patient will complete LB bathing at bed level with Mod A and use of AE PRN. OT Short Term Goal 4 (Week 1): Patient will complete transfer to [facility] with Mod A and LRAD.  Skilled Therapeutic Interventions/Progress Updates:  Session 1: Patient met lying supine in bed in agreement with OT treatment session with focus on self-care re-education, activity tolerance, and functional transfers as detailed below. Patient able to thread LB clothing in long sitting and hike pants over hips rolling R<>L. Patient able to complete partial bridge in supine with Mod A to maintain hip position off bed surface. Supine to EOB with HOB flat and Mod A to bring trunk upright. Seated EOB, patient donned shoes with figure-4 position. Patient able to unpack suitcase and place clothing and person items in drawers with increased time and extended rest breaks between with good carryover and utilization of energy conservation techniques taught in previous session. SB transfer to wc on R with Max A and frequent RB. Seated at sink level ,patient complete 2/3 grooming tasks with set-up assist. Session concluded with patient seated in wc with call bell within reach, belt alarm activated, and all needs met.   Session 2: Afternoon session with focus on self-care re-education and therapeutic activity as detailed below. Hair washing task seated at sink level with Total A for shampooing/conditioning hair.  Patient able to detangle, blow dry and style hair with rest breaks every 5-10 minutes. Session concluded with patient seated in wc with call bell within reach and all needs met.   Therapy Documentation Precautions:  Precautions Precautions: Fall, Other (comment) Precaution Comments: 50-7L via [place] Restrictions Weight Bearing Restrictions: No General:    Therapy/Group: Individual Therapy  Romen Yutzy R Howerton-Davis 12/06/2019, 7:16 AM

## 2019-12-06 NOTE — Plan of Care (Signed)
°  Problem: Consults Goal: RH GENERAL PATIENT EDUCATION Description: See Patient Education module for education specifics. Outcome: Progressing   Problem: RH BOWEL ELIMINATION Goal: RH STG MANAGE BOWEL WITH ASSISTANCE Description: STG Manage Bowel with sup Assistance. Outcome: Progressing   Problem: RH BLADDER ELIMINATION Goal: RH STG MANAGE BLADDER WITH ASSISTANCE Description: STG Manage Bladder With Supv Assistance Outcome: Progressing   Problem: RH SKIN INTEGRITY Goal: RH STG SKIN FREE OF INFECTION/BREAKDOWN Description: Skin will be free of infection/breakdown with min assist Outcome: Progressing Goal: RH STG MAINTAIN SKIN INTEGRITY WITH ASSISTANCE Description: STG Maintain Skin Integrity With min Assistance. Outcome: Progressing   Problem: RH SAFETY Goal: RH STG ADHERE TO SAFETY PRECAUTIONS W/ASSISTANCE/DEVICE Description: STG Adhere to Safety Precautions With MIN Assistance/Device. Outcome: Progressing   Problem: RH PAIN MANAGEMENT Goal: RH STG PAIN MANAGED AT OR BELOW PT'S PAIN GOAL Description: Pain will be managed at 3 out of 10 at MOD I  Outcome: Progressing   Problem: RH KNOWLEDGE DEFICIT GENERAL Goal: RH STG INCREASE KNOWLEDGE OF SELF CARE AFTER HOSPITALIZATION Description: Pt will be able to verbalize self care related to handouts, therapy, and personalization for independent care with min assist  Outcome: Progressing   

## 2019-12-06 NOTE — Discharge Instructions (Addendum)
Inpatient Rehab Discharge Instructions  Bonnie Ballard Discharge date and time: No discharge date for patient encounter.   Activities/Precautions/ Functional Status: Activity: activity as tolerated Diet: regular diet Wound Care: none needed Functional status:  ___ No restrictions     ___ Walk up steps independently ___ 24/7 supervision/assistance   ___ Walk up steps with assistance ___ Intermittent supervision/assistance  ___ Bathe/dress independently ___ Walk with walker     _x__ Bathe/dress with assistance ___ Walk Independently    ___ Shower independently ___ Walk with assistance    ___ Shower with assistance ___ No alcohol     ___ Return to work/school ________  COMMUNITY REFERRALS UPON DISCHARGE:    Home Health:   PT     OT    SNA                   Agency: [facility] [employer]: [phone]   Medical Equipment/Items Ordered: o2 Concentrator (Home set up and Discharge Package), Runner, broadcasting/film/video, Wheelchair, Drop Arm Commode, Tub Advertising copywriter                                                 Agency/Supplier: [employer]  Special Instructions: No driving smoking or alcohol  Recommend follow-up pulmonary services [place]   My questions have been answered and I understand these instructions. I will adhere to these goals and the provided educational materials after my discharge from the hospital.  Patient/Caregiver Signature _______________________________ Date __________  Clinician Signature _______________________________________ Date __________  Please bring this form and your medication list with you to all your follow-up doctor's appointments.   ==========================================  Information on my medicine - ELIQUIS (apixaban)  This medication education was reviewed with me or my healthcare representative as part of my discharge preparation.    Why was Eliquis prescribed for you? Eliquis was prescribed to treat blood clots  that may have been found in the veins of your legs (deep vein thrombosis) or in your lungs (pulmonary embolism) and to reduce the risk of them occurring again.  What do You need to know about Eliquis ? The dose is ONE 5 mg tablet taken TWICE daily.  Eliquis may be taken with or without food.   Try to take the dose about the same time in the morning and in the evening. If you have difficulty swallowing the tablet whole please discuss with your pharmacist how to take the medication safely.  Take Eliquis exactly as prescribed and DO NOT stop taking Eliquis without talking to the doctor who prescribed the medication.  Stopping may increase your risk of developing a new blood clot.  Refill your prescription before you run out.  After discharge, you should have regular check-up appointments with your healthcare provider that is prescribing your Eliquis.    What do you do if you miss a dose? If a dose of ELIQUIS is not taken at the scheduled time, take it as soon as possible on the same day and twice-daily administration should be resumed. The dose should not be doubled to make up for a missed dose.  Important Safety Information A possible side effect of Eliquis is bleeding. You should call your healthcare provider right away if you experience any of the following: ? Bleeding from an injury or your nose that does not stop. ? Unusual colored urine (  red or dark brown) or unusual colored stools (red or black). ? Unusual bruising for unknown reasons. ? A serious fall or if you hit your head (even if there is no bleeding).  Some medicines may interact with Eliquis and might increase your risk of bleeding or clotting while on Eliquis. To help avoid this, consult your healthcare provider or pharmacist prior to using any new prescription or non-prescription medications, including herbals, vitamins, non-steroidal anti-inflammatory drugs (NSAIDs) and supplements.  This website has more information on  Eliquis (apixaban): http://www.eliquis.com/eliquis/home ==========================  Deep Vein Thrombosis    Deep vein thrombosis (DVT) is a condition in which a blood clot forms in a deep vein, such as a lower leg, thigh, or arm vein. A clot is blood that has thickened into a gel or solid. This condition is dangerous. It can lead to serious and even life-threatening complications if the clot travels to the lungs and causes a blockage (pulmonary embolism). It can also damage veins in the leg. This can result in leg pain, swelling, discoloration, and sores (post-thrombotic syndrome).  What are the causes? This condition may be caused by:  A slowdown of blood flow.  Damage to a vein.  A condition that causes blood to clot more easily, such as an inherited clotting disorder.   What increases the risk? The following factors may make you more likely to develop this condition: 1. Being overweight. 2. Being older, especially over age 55. 3. Sitting or lying down for more than four hours. 4. Being in the hospital. 5. Lack of physical activity (sedentary lifestyle). 6. Pregnancy, being in childbirth, or having recently given birth. 7. Taking medicines that contain estrogen, such as medicines to prevent pregnancy. 8. Smoking. 9. A history of any of the following: ? Blood clots or a blood clotting disease. ? Peripheral vascular disease. ? Inflammatory bowel disease. ? Cancer. ? Heart disease. ? Genetic conditions that affect how your blood clots, such as Factor V Leiden mutation. ? Neurological diseases that affect your legs (leg paresis). ? A recent injury, such as a car accident. ? Major or lengthy surgery. ? A central line placed inside a large vein.  What are the signs or symptoms? Symptoms of this condition include:  Swelling, pain, or tenderness in an arm or leg.  Warmth, redness, or discoloration in an arm or leg. If the clot is in your leg, symptoms may be more noticeable  or worse when you stand or walk. Some people may not develop any symptoms.  How is this diagnosed? This condition is diagnosed with: 1. A medical history and physical exam. 2. Tests, such as: ? Blood tests. These are done to check how well your blood clots. ? Ultrasound. This is done to check for clots. ? Venogram. For this test, contrast dye is injected into a vein and X-rays are taken to check for any clots  How is this treated? Treatment for this condition depends on:  The cause of your DVT.  Your risk for bleeding or developing more clots.  Any other medical conditions that you have. Treatment may include: 1. Taking a blood thinner (anticoagulant). This type of medicine prevents clots from forming. It may be taken by mouth, injected under the skin, or injected through an IV (catheter). 2. Injecting clot-dissolving medicines into the affected vein (catheter-directed thrombolysis). 3. Having surgery. Surgery may be done to: ? Remove the clot. ? Place a filter in a large vein to catch blood clots before they reach the lungs.  Some treatments may be continued for up to six months.  Follow these instructions at home: If you are taking blood thinners: 1. Take the medicine exactly as told by your health care provider. Some blood thinners need to be taken at the same time every day. Do not skip a dose. 2. Talk with your health care provider before you take any medicines that contain aspirin or NSAIDs. These medicines increase your risk for dangerous bleeding. 3. Ask your health care provider about foods and drugs that could change the way the medicine works (may interact). Avoid those things if your health care provider tells you to do so. 4. Blood thinners can cause easy bruising and may make it difficult to stop bleeding. Because of this: ? Be very careful when using knives, scissors, or other sharp objects. ? Use an electric razor instead of a blade. ? Avoid activities that could  cause injury or bruising, and follow instructions about how to prevent falls. 5. Wear a medical alert bracelet or carry a card that lists what medicines you take.  General instructions  Take over-the-counter and prescription medicines only as told by your health care provider.  Return to your normal activities as told by your health care provider. Ask your health care provider what activities are safe for you.  Wear compression stockings if recommended by your health care provider.  Keep all follow-up visits as told by your health care provider. This is important.  How is this prevented? To lower your risk of developing this condition again: 1. For 30 or more minutes every day, do an activity that: ? Involves moving your arms and legs. ? Increases your heart rate. 2. When traveling for longer than four hours: ? Exercise your arms and legs every hour. ? Drink plenty of water. ? Avoid drinking alcohol. 3. Avoid sitting or lying for a long time without moving your legs. 4. If you have surgery or you are hospitalized, ask about ways to prevent blood clots. These may include taking frequent walks or using anticoagulants. 5. Stay at a healthy weight. 6. If you are a woman who is older than age 52, avoid unnecessary use of medicines that contain estrogen, such as some birth control pills. 7. Do not use any products that contain nicotine or tobacco, such as cigarettes and e-cigarettes. This is especially important if you take estrogen medicines. If you need help quitting, ask your health care provider.  Contact a health care provider if:  You miss a dose of your blood thinner.  Your menstrual period is heavier than usual.  You have unusual bruising.  Get help right away if: 1. You have: ? New or increased pain, swelling, or redness in an arm or leg. ? Numbness or tingling in an arm or leg. ? Shortness of breath. ? Chest pain. ? A rapid or irregular heartbeat. ? A severe headache or  confusion. ? A cut that will not stop bleeding. 2. There is blood in your vomit, stool, or urine. 3. You have a serious fall or accident, or you hit your head. 4. You feel light-headed or dizzy. 5. You cough up blood.  These symptoms may represent a serious problem that is an emergency. Do not wait to see if the symptoms will go away. Get medical help right away. Call your local emergency services (911 in the [place] Do not drive yourself to the hospital. Summary  Deep vein thrombosis (DVT) is a condition in which a blood clot forms in  a deep vein, such as a lower leg, thigh, or arm vein.  Symptoms can include swelling, warmth, pain, and redness in your leg or arm.  This condition may be treated with a blood thinner (anticoagulant medicine), medicine that is injected to dissolve blood clots,compression stockings, or surgery.  If you are prescribed blood thinners, take them exactly as told. This information is not intended to replace advice given to you by your health care provider. Make sure you discuss any questions you have with your health care provider. Document Revised: 03/18/2017 Document Reviewed: 09/03/2016 Elsevier Patient Education  2020 [employer].

## 2019-12-06 NOTE — Progress Notes (Signed)
Patient ID: Bonnie Ballard, female   DOB: [birth year], 54 y.o.   MRN: [id number] Met with the patient to review role of the nurse CM and collaboration with the SW Margreta Journey) to facilitate preparations for discharge. Patient notes feels like a totally different person since COVID; out of sorts for the past three months. She notes panic attacks are new, never experienced this before. Concerned about lasix and urgency with voiding and not being able to just get up and go to the toilet. She spoke with the MD regarding probability of going home with O2 and has no issues with that. She is anxious to get home to her family/grandchild.  Reviewed modifiable health risks including HTN and HLD and protein deficiency. Reviewed recommended tips for management of diet and treatment for deconditioning. Patient reports she likes the Boost drinks and is open to adding protein to her diet.  No other issues/concerns noted at present. Margarito Liner

## 2019-12-06 NOTE — Progress Notes (Signed)
Physical Therapy Session Note  Patient Details  Name: Bonnie Ballard MRN: [id number] Date of Birth: Sep 02, 1965  Today's Date: 12/06/2019 PT Individual Time: 8115-7262 PT Individual Time Calculation (min): 41 min   Short Term Goals: Week 1:  PT Short Term Goal 1 (Week 1): Pt will perform bed mobility with min assist consistently PT Short Term Goal 2 (Week 1): Pt will initiate gait training in parallel bars PT Short Term Goal 3 (Week 1): Pt will propell WC 150f with supervision assist PT Short Term Goal 4 (Week 1): [facility]  Skilled Therapeutic Interventions/Progress Updates:   Pt received sitting in WC and agreeable to PT. Pt reports need to toilet. Pt instructed in SB transfer to bariatric BSC in room. Mod assist overall and moderate cues for UE and LE placement as well as cues for head/hips relationship. Multiple rest breaks through transfer due to increased anxiety and mild SOB. Once on toilet. PT assisted pt to doff and don pants with max assist using lateral lean. Pt  Able to perform self pericare with lateral lean. [facility] and min cues for positioning. Once completed, pt performed hand hygiene at sink with supervision assist. Cues for pursed lip breathing throughout session as well as instruction to not hold breath in flexed positions. [facility] and all needs met.         Therapy Documentation Precautions:  Precautions Precautions: Fall, Other (comment) Precaution Comments: 5-7L via [place] Restrictions Weight Bearing Restrictions: No:   Pain: denies   Therapy/Group: Individual Therapy  ALorie Phenix8/19/2021, 1:48 PM

## 2019-12-06 NOTE — Progress Notes (Signed)
[facility] PHYSICAL MEDICINE & REHABILITATION PROGRESS NOTE   Subjective/Complaints:    ROS- neg CP, SOB, N/V/D  Objective:   No results found. Recent Labs    12/05/19 0608  WBC 18.1*  HGB 13.4  HCT 42.2  PLT 165   Recent Labs    12/05/19 0608  NA 141  K 4.8  CL 99  CO2 33*  GLUCOSE 131*  BUN 21*  CREATININE 0.63  CALCIUM 8.9    Intake/Output Summary (Last 24 hours) at 12/06/2019 0732 Last data filed at 12/05/2019 2214 Gross per 24 hour  Intake 120 ml  Output --  Net 120 ml     Physical Exam: Vital Signs Blood pressure 125/87, pulse 84, temperature 97.6 F (36.4 C), resp. rate 18, SpO2 96 %.   General: No acute distress Mood and affect are appropriate Heart: Regular rate and rhythm no rubs murmurs or extra sounds Lungs: Clear to auscultation, breathing unlabored, no rales or wheezes Abdomen: Positive bowel sounds, soft nontender to palpation, nondistended Extremities: No clubbing, cyanosis, or edema Skin: No evidence of breakdown, no evidence of rash   Musculoskeletal: no pain with UE or LE active ROM  No joint swelling    Assessment/Plan: 1. Functional deficits secondary to CIM which require 3+ hours per day of interdisciplinary therapy in a comprehensive inpatient rehab setting.  Physiatrist is providing close team supervision and 24 hour management of active medical problems listed below.  Physiatrist and rehab team continue to assess barriers to discharge/monitor patient progress toward functional and medical goals  Care Tool:  Bathing    Body parts bathed by patient: Right arm, Left arm, Chest, Abdomen, Face   Body parts bathed by helper: Front perineal area, Buttocks, Right upper leg, Left upper leg, Right lower leg, Left lower leg     Bathing assist Assist Level: Maximal Assistance - Patient 24 - 49%     Upper Body Dressing/Undressing Upper body dressing   What is the patient wearing?: Bra, Pull over shirt    Upper body assist  Assist Level: Minimal Assistance - Patient > 75%    Lower Body Dressing/Undressing Lower body dressing      What is the patient wearing?: Pants, Underwear/pull up     Lower body assist Assist for lower body dressing: Maximal Assistance - Patient 25 - 49%     Toileting Toileting    Toileting assist Assist for toileting: Minimal Assistance - Patient > 75%     Transfers Chair/bed transfer  Transfers assist     Chair/bed transfer assist level: Maximal Assistance - Patient 25 - 49%     Locomotion Ambulation   Ambulation assist   Ambulation activity did not occur: Safety/medical concerns          Walk 10 feet activity   Assist  Walk 10 feet activity did not occur: Safety/medical concerns        Walk 50 feet activity   Assist Walk 50 feet with 2 turns activity did not occur: Safety/medical concerns         Walk 150 feet activity   Assist Walk 150 feet activity did not occur: Safety/medical concerns         Walk 10 feet on uneven surface  activity   Assist Walk 10 feet on uneven surfaces activity did not occur: Safety/medical concerns         Wheelchair     Assist   Type of Wheelchair: Manual    Wheelchair assist level: Minimal Assistance -  Patient > 75%      Wheelchair 50 feet with 2 turns activity    Assist        Assist Level: Minimal Assistance - Patient > 75%   Wheelchair 150 feet activity     Assist      Assist Level: Minimal Assistance - Patient > 75%   Blood pressure 125/87, pulse 84, temperature 97.6 F (36.4 C), resp. rate 18, SpO2 96 %.  Medical Problem List and Plan: 1.  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  Continue prednisone with taper currently at 40 mg twice daily.  Currently with 5 L oxygen nasal cannula             -patient may  shower             -ELOS/Goals:  Mod I to supervision 2-3 weeks CIR PT, OT, very deconditioned  2.   Antithrombotics: -DVT/anticoagulation: Bilateral lower extremity DVTs.  Patient will remain on Eliquis 5 mg twice daily for a minimum of 3 months total             -antiplatelet therapy: N/A 3. Pain Management: Hydrocodone 1 tablet every 6 hours as needed- pt said she took Percocet 10/325 mg QID at home- Add heat , Kpad 4. Mood: Clonazepam 0.5 mg 3 times daily, Paxil 40 mg daily, melatonin 6 mg nightly- will see if possible to wean             -antipsychotic agents: N/A 5. Neuropsych: This patient is capable of making decisions on her own behalf. 6. Skin/Wound Care: Routine skin checks 7. Fluids/Electrolytes/Nutrition: Routine in and outs with follow-up chemistries 8.  Hypothyroidism.  Continue Synthroid 25 mcg Monday Wednesday Friday 9.  Hypertension.  Lasix 40 mg daily, metoprolol 50 mg twice daily Vitals:   12/06/19 0535 12/06/19 0536  BP: (!) 133/91 125/87  Pulse: 82 84  Resp: 18   Temp: 97.6 F (36.4 C)   SpO2: 96%   controlled 8/19 10.  Hyperlipidemia.  Lipitor 40 mg daily 11.  History of gastric bypass 2017.  Dietary follow-up 12.  History left TKA April 2021.  Weightbearing as tolerated- left knee actually hurts more than RIght  And needs TKR on R knee, per pt.  LOS: 2 days A FACE TO FACE EVALUATION WAS PERFORMED  Erick Colace 12/06/2019, 7:32 AM

## 2019-12-06 NOTE — Progress Notes (Signed)
Physical Therapy Session Note  Patient Details  Name: Bonnie Ballard MRN: [id number] Date of Birth: 08/21/1965  Today's Date: 12/06/2019 PT Individual Time: 1100-1200 PT Individual Time Calculation (min): 60 min   Short Term Goals: Week 1:  PT Short Term Goal 1 (Week 1): Pt will perform bed mobility with min assist consistently PT Short Term Goal 2 (Week 1): Pt will initiate gait training in parallel bars PT Short Term Goal 3 (Week 1): Pt will propell WC 184ft with supervision assist PT Short Term Goal 4 (Week 1): pt will tras[facility]fer to and from [facility] with SB and mod assist  Skilled Therapeutic Interventions/Progress Updates:    PAIN c/o pain in knees due to pressure contacting lower limbs  Pt on 5 L02 via [place] for session. Pt initially oob in wc requesting to use bathroom.  States "we tried that board and it was a disaster".  Obtained +2 assist and [place]. STS in [place] from wc w/max assist of 2 w/assist from side w/poor ability to stand/clear hips.  Returned to semistand in stedy and rested 4-5 min, 02 sats 84%, recovers to low 90s  W/cues for breathing. Repeated STS in st[place] mult times for commode transfer, clothing management, hygiene/pt total assist for these activities.  Requires extended rest breaks for recovery, 02sats 81-94% on 5L. Repeated STS w/several approaches and determined assisting pt STS in stedy w/therapist assisting from front w/facilitation under hips to promote extension was least labor intensive for therapist and optimized pt ability to power up.  Pt transferred back to wc via [place] as above.    Pt then propelled wc 161ft w/mult brief rest breaks w/assist to manage 02 tank.  At end of session, pt transported back to room and left oob in wc w/needs in reach.  Therapy Documentation Precautions:  Precautions Precautions: Fall, Other (comment) Precaution Comments: 5-7L via [place] Restrictions Weight Bearing Restrictions: No   Therapy/Group: Individual Therapy  Rada Hay, PT    Shearon Balo 12/06/2019, 3:25 PM

## 2019-12-06 NOTE — Progress Notes (Signed)
   Patient Details  Name: Bonnie Ballard MRN: [id number] Date of Birth: 02/03/1966  Today's Date: 12/06/2019  Hospital Problems: Principal Problem:   Intensive care (ICU) myopathy Active Problems:   Pneumonia due to COVID-19 virus   Critical illness myopathy  Past Medical History: No past medical history on file. Past Surgical History:  Social History:  has no history on file for tobacco use, alcohol use, and drug use.  Family / Support Systems Marital Status: Divorced Children: 2 DTRS (Bonnie Ballard,31 and Bonnie Ballard,24) Other Supports: Sister Bonnie Ballard) Anticipated Caregiver: Bonnie Ballard (Does not work) Ability/Limitations of Caregiver: none Caregiver Availability: 24/7  Social History Preferred language: English Religion:  Cultural Background: Herbalist Education: Associates Read: Yes Write: Yes Employment Status: Employed Marine scientist Issues: no   Abuse/Neglect Abuse/Neglect Assessment Can Be Completed: Yes Physical Abuse: Denies Verbal Abuse: Denies Sexual Abuse: Denies Exploitation of patient/patient's resources: Denies Self-Neglect: Denies  Emotional Status Pt's affect, behavior and adjustment status: Off and on depression Recent Psychosocial Issues: no Psychiatric History: Yes Substance Abuse History: no  Patient / Family Perceptions, Expectations & Goals Pt/Family understanding of illness & functional limitations: Yes Premorbid pt/family roles/activities: Pt was totallyindependent previously Anticipated changes in roles/activities/participation: Dtrs will asisst with finances, household duties, driving, etc Pt/family expectations/goals: Goal to discharge back home with dtr  [employer]: None Premorbid Home Care/DME Agencies: None Transportation available at discharge: Family able to transport Resource referrals recommended: Neuropsychology  Discharge Planning Living Arrangements: Children, Other relatives Support Systems:  Children, Other relatives Type of Residence: Private residence (2 Level home, pt one 1st level. 2 Steps to enter fron door with railings) Insurance Resources: Medicare Financial Screen Referred: No Living Expenses: Mortgage Money Management: Patient Does the patient have any problems obtaining your medications?: No Care Coordinator Anticipated Follow Up Needs: HH/OP  Clinical Impression Sw entered room introduced self, explained role and process. Addressed questions and concerns, sw will continue to follow up.  [name] 12/06/2019, 12:37 PM

## 2019-12-07 ENCOUNTER — Inpatient Hospital Stay (HOSPITAL_COMMUNITY): Payer: Medicare Other | Admitting: Occupational Therapy

## 2019-12-07 ENCOUNTER — Inpatient Hospital Stay (HOSPITAL_COMMUNITY): Payer: Medicare Other | Admitting: Physical Therapy

## 2019-12-07 MED ORDER — PREDNISONE 20 MG PO TABS
20.0000 mg | ORAL_TABLET | Freq: Two times a day (BID) | ORAL | Status: AC
  Administered 2019-12-12 – 2019-12-16 (×10): 20 mg via ORAL
  Filled 2019-12-07 (×10): qty 1

## 2019-12-07 MED ORDER — GUAIFENESIN 100 MG/5ML PO SOLN
10.0000 mL | Freq: Four times a day (QID) | ORAL | Status: DC | PRN
  Administered 2019-12-07 – 2019-12-26 (×29): 200 mg via ORAL
  Filled 2019-12-07 (×2): qty 10
  Filled 2019-12-07: qty 5
  Filled 2019-12-07: qty 10
  Filled 2019-12-07: qty 5
  Filled 2019-12-07: qty 10
  Filled 2019-12-07 (×2): qty 5
  Filled 2019-12-07: qty 10
  Filled 2019-12-07: qty 5
  Filled 2019-12-07: qty 10
  Filled 2019-12-07: qty 5
  Filled 2019-12-07: qty 10
  Filled 2019-12-07: qty 25
  Filled 2019-12-07: qty 50
  Filled 2019-12-07 (×2): qty 10
  Filled 2019-12-07: qty 5
  Filled 2019-12-07 (×3): qty 10
  Filled 2019-12-07: qty 5
  Filled 2019-12-07: qty 10
  Filled 2019-12-07: qty 5
  Filled 2019-12-07 (×4): qty 10
  Filled 2019-12-07: qty 50
  Filled 2019-12-07 (×2): qty 10
  Filled 2019-12-07: qty 5
  Filled 2019-12-07: qty 10

## 2019-12-07 MED ORDER — PREDNISONE 5 MG PO TABS
30.0000 mg | ORAL_TABLET | Freq: Two times a day (BID) | ORAL | Status: AC
  Administered 2019-12-08 – 2019-12-11 (×8): 30 mg via ORAL
  Filled 2019-12-07 (×9): qty 1

## 2019-12-07 MED ORDER — PREDNISONE 5 MG PO TABS: 2.5000 mg | ORAL_TABLET | Freq: Every day | ORAL | Status: DC

## 2019-12-07 MED ORDER — PREDNISONE 5 MG PO TABS
10.0000 mg | ORAL_TABLET | Freq: Every day | ORAL | Status: AC
  Administered 2019-12-21 – 2019-12-24 (×4): 10 mg via ORAL
  Filled 2019-12-07 (×4): qty 2

## 2019-12-07 MED ORDER — PREDNISONE 20 MG PO TABS
20.0000 mg | ORAL_TABLET | Freq: Every day | ORAL | Status: AC
  Administered 2019-12-17 – 2019-12-20 (×4): 20 mg via ORAL
  Filled 2019-12-07 (×4): qty 1

## 2019-12-07 MED ORDER — PREDNISONE 5 MG PO TABS: 5.0000 mg | ORAL_TABLET | Freq: Every day | ORAL | Status: DC

## 2019-12-07 NOTE — IPOC Note (Signed)
Overall Plan of Care [facility]) Patient Details Name: Bonnie Ballard MRN: [id number] DOB: 10-Feb-1966  Admitting Diagnosis: Intensive care (ICU) myopathy  Hospital Problems: Principal Problem:   Intensive care (ICU) myopathy Active Problems:   Pneumonia due to COVID-19 virus   Critical illness myopathy     Functional Problem List: Nursing Pain, Bladder, Edema, Endurance, Skin Integrity  PT Balance, Edema, Endurance, Motor  OT Balance, Endurance, Skin Integrity  SLP    TR         Basic ADL's: OT Bathing, Dressing, Toileting     Advanced  ADL's: OT       Transfers: PT Bed Mobility, Bed to Chair, Car, Furniture, Floor  OT Toilet, Research scientist (life sciences): PT Ambulation, Psychologist, prison and probation services, Stairs     Additional Impairments: OT None  SLP        TR      Anticipated Outcomes Item Anticipated Outcome  Self Feeding Independent  Swallowing      Basic self-care  Set-up assist UB, Min A LB  Toileting  Min A   Bathroom Transfers Min A  Bowel/Bladder  pt will be continent x 2  Transfers  Min assist with LRAD  Locomotion  WC level Mod I. min assist gait for short distances with LRAD  Communication     Cognition     Pain  pt will be able to verbalize pain of less than 3 out of 10  Safety/Judgment  pt will remain fall free while in rehab   Therapy Plan: PT Intensity: Minimum of 1-2 x/day ,45 to 90 minutes PT Frequency: 5 out of 7 days PT Duration Estimated Length of Stay: 21-24 days OT Intensity: Minimum of 1-2 x/day, 45 to 90 minutes OT Frequency: 5 out of 7 days OT Duration/Estimated Length of Stay: 21-24 days     Due to the current state of emergency, patients may not be receiving their 3-hours of Medicare-mandated therapy.   Team Interventions: Nursing Interventions Patient/Family Education, Disease Management/Prevention, Skin Care/Wound Management, Discharge Planning, Bladder Management, Pain Management  PT interventions Ambulation/gait training,  Community reintegration, DME/adaptive equipment instruction, Neuromuscular re-education, Psychosocial support, Stair training, UE/LE Strength taining/ROM, Wheelchair propulsion/positioning, UE/LE Coordination activities, Skin care/wound management, Therapeutic Activities, Pain management, Discharge planning, Functional electrical stimulation, Warden/ranger, Cognitive remediation/compensation, Disease management/prevention, Functional mobility training, Therapeutic Exercise, Splinting/orthotics, Patient/family education, Visual/perceptual remediation/compensation  OT Interventions Warden/ranger, Community reintegration, Discharge planning, DME/adaptive equipment instruction, Functional mobility training, Neuromuscular re-education, Patient/family education, Self Care/advanced ADL retraining, Skin care/wound managment, Therapeutic Activities, Therapeutic Exercise, UE/LE Strength taining/ROM  SLP Interventions    TR Interventions    SW/CM Interventions Discharge Planning, Psychosocial Support, Patient/Family Education   Barriers to Discharge MD  Medical stability and Weight  Nursing      PT Inaccessible home environment, Decreased caregiver support, Home environment Best boy, Community education officer for SNF coverage, Weight    OT New oxygen    SLP      SW       Team Discharge Planning: Destination: PT-Home ,OT- Home , SLP-  Projected Follow-up: PT-Home health PT, OT-  Home health OT, 24 hour supervision/assistance, SLP-  Projected Equipment Needs: PT-Wheelchair (measurements), Wheelchair cushion (measurements), To be determined, OT-  , SLP-  Equipment Details: PT- , OT-  Patient/family involved in discharge planning: PT- Patient,  OT-Patient, SLP-   MD ELOS: 21-25d Medical Rehab Prognosis:  Good Assessment:  54 year old right-handed female history of gastric bypass 2017, hypertension, sarcoidosis, hyperlipidemia, hypothyroidism, left partial TKA April 2021.  Per chart  review she lives with her 2 adult aged daughters in [place].  Daughters assist as needed.  To level home bed and bath on main level 2 steps to entry.  Reportedly independent prior to admission.  Presented to outside hospital 09/23/2019 [facility] with COVID-19 pneumonia developed ARDS requiring BiPAP narrowly avoiding intubation and was maintained on Solu-Medrol.  She was weaned down to high flow oxygen.  Hospital course complicated by bilateral DVTs and chest CT negative for pulmonary emboli.  She was placed on Eliquis for DVT.  Hospital course bouts of insomnia maintained on melatonin as well as anxiety with Klonopin 3 times daily.   Now requiring 24/7 Rehab RN,MD, as well as CIR level PT, OT and SLP.  Treatment team will focus on ADLs and mobility with goals set at MinA/Sup See Team Conference Notes for weekly updates to the plan of care

## 2019-12-07 NOTE — Progress Notes (Signed)
[facility] PHYSICAL MEDICINE & REHABILITATION PROGRESS NOTE   Subjective/Complaints:  No resp distress, knees sore from using Steadi lift  ROS- neg CP, SOB, N/V/D  Objective:   No results found. Recent Labs    12/05/19 0608  WBC 18.1*  HGB 13.4  HCT 42.2  PLT 165   Recent Labs    12/05/19 0608  NA 141  K 4.8  CL 99  CO2 33*  GLUCOSE 131*  BUN 21*  CREATININE 0.63  CALCIUM 8.9    Intake/Output Summary (Last 24 hours) at 12/07/2019 0751 Last data filed at 12/06/2019 2240 Gross per 24 hour  Intake 638 ml  Output --  Net 638 ml     Physical Exam: Vital Signs Blood pressure (!) 135/93, pulse 88, temperature 98.5 F (36.9 C), resp. rate 18, SpO2 93 %.    General: No acute distress Mood and affect are appropriate Heart: Regular rate and rhythm no rubs murmurs or extra sounds Lungs: Clear to auscultation, breathing unlabored, no rales or wheezes Abdomen: Positive bowel sounds, soft nontender to palpation, nondistended Extremities: No clubbing, cyanosis, or edema Skin: No evidence of breakdown, no evidence of rash Neurologic: Cranial nerves II through XII intact, motor strength is 4/5 in bilateral deltoid, bicep, tricep, grip, hip flexor, knee extensors, ankle dorsiflexor and plantar flexor     Musculoskeletal: no pain with UE or LE active ROM  No joint swelling    Assessment/Plan: 1. Functional deficits secondary to CIM which require 3+ hours per day of interdisciplinary therapy in a comprehensive inpatient rehab setting.  Physiatrist is providing close team supervision and 24 hour management of active medical problems listed below.  Physiatrist and rehab team continue to assess barriers to discharge/monitor patient progress toward functional and medical goals  Care Tool:  Bathing    Body parts bathed by patient: Right arm, Left arm, Chest, Abdomen, Face   Body parts bathed by helper: Front perineal area, Buttocks, Right upper leg, Left upper leg,  Right lower leg, Left lower leg     Bathing assist Assist Level: Maximal Assistance - Patient 24 - 49%     Upper Body Dressing/Undressing Upper body dressing   What is the patient wearing?: Bra, Pull over shirt    Upper body assist Assist Level: Moderate Assistance - Patient 50 - 74%    Lower Body Dressing/Undressing Lower body dressing      What is the patient wearing?: Pants, Underwear/pull up     Lower body assist Assist for lower body dressing: Maximal Assistance - Patient 25 - 49%     Toileting Toileting    Toileting assist Assist for toileting: Minimal Assistance - Patient > 75%     Transfers Chair/bed transfer  Transfers assist     Chair/bed transfer assist level: 2 Helpers Occupational hygienist)     Locomotion Ambulation   Ambulation assist   Ambulation activity did not occur: Safety/medical concerns          Walk 10 feet activity   Assist  Walk 10 feet activity did not occur: Safety/medical concerns        Walk 50 feet activity   Assist Walk 50 feet with 2 turns activity did not occur: Safety/medical concerns         Walk 150 feet activity   Assist Walk 150 feet activity did not occur: Safety/medical concerns         Walk 10 feet on uneven surface  activity   Assist Walk 10 feet on uneven surfaces  activity did not occur: Safety/medical concerns         Wheelchair     Assist Will patient use wheelchair at discharge?: Yes Type of Wheelchair: Manual    Wheelchair assist level: Supervision/Verbal cueing (therapist assisted w/mgmt of o2 tank) Max wheelchair distance: 150    Wheelchair 50 feet with 2 turns activity    Assist        Assist Level: Supervision/Verbal cueing   Wheelchair 150 feet activity     Assist      Assist Level: Supervision/Verbal cueing (mult brief rest breaks)   Blood pressure (!) 135/93, pulse 88, temperature 98.5 F (36.9 C), resp. rate 18, SpO2 93 %.  Medical Problem List and  Plan: 1.  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  Continue prednisone with taper currently at 40 mg twice daily.  Currently with 5 L oxygen nasal cannula             -patient may  shower             -ELOS/Goals:  Mod I to supervision 2-3 weeks CIR PT, OT, very deconditioned  2.  Antithrombotics: -DVT/anticoagulation: Bilateral lower extremity DVTs.  Patient will remain on Eliquis 5 mg twice daily for a minimum of 3 months total             -antiplatelet therapy: N/A 3. Pain Management: Hydrocodone 1 tablet every 6 hours as needed- pt said she took Percocet 10/325 mg QID at home- Add heat , Kpad 4. Mood: Clonazepam 0.5 mg 3 times daily, Paxil 40 mg daily, melatonin 6 mg nightly- will see if possible to wean             -antipsychotic agents: N/A 5. Neuropsych: This patient is capable of making decisions on her own behalf. 6. Skin/Wound Care: Routine skin checks 7. Fluids/Electrolytes/Nutrition: Routine in and outs with follow-up chemistries 8.  Hypothyroidism.  Continue Synthroid 25 mcg Monday Wednesday Friday 9.  Hypertension.  Lasix 40 mg daily, metoprolol 50 mg twice daily Vitals:   12/06/19 2134 12/07/19 0506  BP:  (!) 135/93  Pulse: 94 88  Resp: 16 18  Temp:  98.5 F (36.9 C)  SpO2: 97% 93%  controlled 8/19 10.  Hyperlipidemia.  Lipitor 40 mg daily 11.  History of gastric bypass 2017.  Dietary follow-up 12.  History left TKA April 2021.  Weightbearing as tolerated- left knee actually hurts more than RIght  And needs TKR on R knee, per pt. 13.  Discussed grounds pass if portable O2 is available  LOS: 3 days A FACE TO FACE EVALUATION WAS PERFORMED  Erick Colace 12/07/2019, 7:51 AM

## 2019-12-07 NOTE — Plan of Care (Signed)
  Problem: Consults Goal: RH GENERAL PATIENT EDUCATION Description: See Patient Education module for education specifics. Outcome: Progressing   Problem: RH BOWEL ELIMINATION Goal: RH STG MANAGE BOWEL WITH ASSISTANCE Description: STG Manage Bowel with sup Assistance. Outcome: Progressing   Problem: RH BLADDER ELIMINATION Goal: RH STG MANAGE BLADDER WITH ASSISTANCE Description: STG Manage Bladder With Supv Assistance Outcome: Progressing   Problem: RH SKIN INTEGRITY Goal: RH STG SKIN FREE OF INFECTION/BREAKDOWN Description: Skin will be free of infection/breakdown with min assist Outcome: Progressing Goal: RH STG MAINTAIN SKIN INTEGRITY WITH ASSISTANCE Description: STG Maintain Skin Integrity With min Assistance. Outcome: Progressing   Problem: RH SAFETY Goal: RH STG ADHERE TO SAFETY PRECAUTIONS W/ASSISTANCE/DEVICE Description: STG Adhere to Safety Precautions With MIN Assistance/Device. Outcome: Progressing   Problem: RH PAIN MANAGEMENT Goal: RH STG PAIN MANAGED AT OR BELOW PT'S PAIN GOAL Description: Pain will be managed at 3 out of 10 at MOD I  Outcome: Progressing   Problem: RH KNOWLEDGE DEFICIT GENERAL Goal: RH STG INCREASE KNOWLEDGE OF SELF CARE AFTER HOSPITALIZATION Description: Pt will be able to verbalize self care related to handouts, therapy, and personalization for independent care with min assist  Outcome: Progressing   

## 2019-12-07 NOTE — Progress Notes (Signed)
Physical Therapy Session Note  Patient Details  Name: Bonnie Ballard MRN: [id number] Date of Birth: [birth year]  Today's Date: 12/07/2019 PT Individual Time: 1050-1200 and 1625-1705   70 min and 40 min   Short Term Goals: Week 1:  PT Short Term Goal 1 (Week 1): Pt will perform bed mobility with min assist consistently PT Short Term Goal 2 (Week 1): Pt will initiate gait training in parallel bars PT Short Term Goal 3 (Week 1): Pt will propell WC 158f with supervision assist PT Short Term Goal 4 (Week 1): [facility]  Skilled Therapeutic Interventions/Progress Updates:  Session 1  Pt received sitting in WC and agreeable to PT. Pt performed WC mobility x 1554fand 10024fith supervision assist from PT with cues for improved turning technique and management of tight spaces in therapy gym. Sit<>stand in standing frame x 6. Orthostatic VS taken with pt tolerating ~ 45 seconds in standing for VS assessment.   Sitting: 94/57, HR 87, SpO2 100% on 6L/min. Standing 104/64, HR 80, SpO2 100% on 6L/min. Return to sitting 104/67. HR 83, SpO2 desat to 91% on 6l/min and increased to 100% in 40sec. O2 dropped to 4Lmin. .VS taking in stnaidng 115/73, SpO2 desat to 90% on 4L. Returned to sitting, 117/78, HR 89, SpO2 desat to 84%. 1 min to increase to >90%. Pt reports increased SOB with desat. O2 remained on 6L.min throughout remainder of PT treatment. Pt able to perform 2 additional bouts in standing for 2.5 and 3 min respectively    Seated LE therex. LAQ, hip abduction, ankle PF. HS curl. Each completed 2 x 10 with cues for decreased speed of eccentric movement. Patient returned to room and [facility] and all needs met.    Session 2.  Pt received supine in bed and agreeable to PT at bed level. PT instructed pt in LE therex. SAQ x 8, AAROM SLR x 10 , hip abduction with level 2 tband, anlke PF x 15, quad sets x 10 , glute sets x 10, isometric  hip adduction x 12. prolonged rest break between bouts required as pt with increased anxiety and SOB this PM. SpO2 >90% throughout session. Pt left supine in bed with call bell in reach and all needs met.       Therapy Documentation Precautions:  Precautions Precautions: Fall, Other (comment) Precaution Comments: 5-7L via [place] Restrictions Weight Bearing Restrictions: No    Vital Signs: Therapy Vitals Temp: 98.5 F (36.9 C) Pulse Rate: 88 Resp: 18 BP: (!) 135/93 Patient Position (if appropriate): Lying Oxygen Therapy SpO2: 93 % O2 Device: Nasal Cannula   Therapy/Group: Individual Therapy  AusLorie Phenix20/2021, 8:02 AM

## 2019-12-07 NOTE — Progress Notes (Signed)
Occupational Therapy Session Note  Patient Details  Name: Bonnie Ballard MRN: [id number] Date of Birth: 19-Jul-1965  Today's Date: 12/07/2019 OT Individual Time: 2130-8657 OT Individual Time Calculation (min): 62 min    Short Term Goals: Week 1:  OT Short Term Goal 1 (Week 1): Patient will complete sit to stand with use of Stedy and Mod A. OT Short Term Goal 2 (Week 1): Patient will complete UB dressing seated EOB with supervision A. OT Short Term Goal 3 (Week 1): Patient will complete LB bathing at bed level with Mod A and use of AE PRN. OT Short Term Goal 4 (Week 1): Patient will complete transfer to [facility] with Mod A and LRAD.  Skilled Therapeutic Interventions/Progress Updates:  Patient met lying supine in bed in agreement with OT treatment session with focus on self-care re-education, functional tranfers, and activity tolerance as detailed below. Patient c/o bilateral knee pain s/p premedication. Supine to EOB transfer Mod A and use of bed rail. LB bathing/dressing in supine and seated EOB with Max A, frequent and extended rest breaks. Patient able to wash/dy legs seated EOB and thread BLE through LB clothing but returned to supine to hike pants over hips. Patient desat to 81% on 5L returning to >90% with bump to 7L and ~15-30sec. UB bathing/dressing seated EOB with set-up A and frequent rest breaks for energy conservation. Seated at sink level, patient completed 3/3 grooming tasks with set-up assist. Session concluded with patient seated at sink level for completion of grooming tasks with call bell within reach and all needs met. NT aware of patient position.   Therapy Documentation Precautions:  Precautions Precautions: Fall, Other (comment) Precaution Comments: 5-7L via [place] Restrictions Weight Bearing Restrictions: No General:    Therapy/Group: Individual Therapy  Jonaya Freshour R [place] 12/07/2019, 7:25 AM

## 2019-12-08 DIAGNOSIS — U071 COVID-19: Secondary | ICD-10-CM

## 2019-12-08 DIAGNOSIS — J1282 Pneumonia due to coronavirus disease 2019: Secondary | ICD-10-CM

## 2019-12-08 DIAGNOSIS — F418 Other specified anxiety disorders: Secondary | ICD-10-CM

## 2019-12-08 NOTE — Plan of Care (Signed)
°  Problem: RH BOWEL ELIMINATION Goal: RH STG MANAGE BOWEL WITH ASSISTANCE Description: STG Manage Bowel with sup Assistance. Outcome: Progressing   Problem: RH BLADDER ELIMINATION Goal: RH STG MANAGE BLADDER WITH ASSISTANCE Description: STG Manage Bladder With Supv Assistance Outcome: Progressing   Problem: RH SKIN INTEGRITY Goal: RH STG SKIN FREE OF INFECTION/BREAKDOWN Description: Skin will be free of infection/breakdown with min assist Outcome: Progressing

## 2019-12-08 NOTE — Progress Notes (Signed)
[facility] PHYSICAL MEDICINE & REHABILITATION PROGRESS NOTE   Subjective/Complaints:  Overall happy with progress. Can't get stronger soon enough. Dealing with anxiety at times. Feet always feel cold  ROS: Patient denies fever, rash, sore throat, blurred vision, nausea, vomiting, diarrhea, cough, shortness of breath or chest pain,  headache, or mood change.   Objective:   No results found. No results for input(s): WBC, HGB, HCT, PLT in the last 72 hours. No results for input(s): NA, K, CL, CO2, GLUCOSE, BUN, CREATININE, CALCIUM in the last 72 hours.  Intake/Output Summary (Last 24 hours) at 12/08/2019 1208 Last data filed at 12/07/2019 2200 Gross per 24 hour  Intake 120 ml  Output --  Net 120 ml     Physical Exam: Vital Signs Blood pressure 124/85, pulse 82, temperature 97.9 F (36.6 C), temperature source Oral, resp. rate 16, SpO2 97 %.    Constitutional: No distress . Vital signs reviewed. HEENT: EOMI, oral membranes moist Neck: supple Cardiovascular: RRR without murmur. No JVD    Respiratory/Chest: CTA Bilaterally without wheezes or rales. Normal effort    GI/Abdomen: BS +, non-tender, non-distended Ext: no clubbing, cyanosis, or edema, feet are warm Psych: pleasant and cooperative, a little anxious Neurologic: Cranial nerves II through XII intact, motor strength is 4/5 in bilateral deltoid, bicep, tricep, grip, hip flexor, knee extensors, ankle dorsiflexor and plantar flexor Musculoskeletal: no pain with UE or LE active ROM  No joint swelling    Assessment/Plan: 1. Functional deficits secondary to CIM which require 3+ hours per day of interdisciplinary therapy in a comprehensive inpatient rehab setting.  Physiatrist is providing close team supervision and 24 hour management of active medical problems listed below.  Physiatrist and rehab team continue to assess barriers to discharge/monitor patient progress toward functional and medical goals  Care  Tool:  Bathing    Body parts bathed by patient: Front perineal area, Buttocks, Right upper leg, Right lower leg, Left upper leg, Left lower leg, Face   Body parts bathed by helper: Front perineal area, Buttocks, Right upper leg, Left upper leg, Right lower leg, Left lower leg Body parts n/a: Right arm, Left arm, Chest, Abdomen   Bathing assist Assist Level: Maximal Assistance - Patient 24 - 49%     Upper Body Dressing/Undressing Upper body dressing   What is the patient wearing?: Bra, Pull over shirt    Upper body assist Assist Level: Minimal Assistance - Patient > 75%    Lower Body Dressing/Undressing Lower body dressing      What is the patient wearing?: Pants, Underwear/pull up     Lower body assist Assist for lower body dressing: Maximal Assistance - Patient 25 - 49%     Toileting Toileting    Toileting assist Assist for toileting: Minimal Assistance - Patient > 75%     Transfers Chair/bed transfer  Transfers assist     Chair/bed transfer assist level: 2 Helpers Occupational hygienist)     Locomotion Ambulation   Ambulation assist   Ambulation activity did not occur: Safety/medical concerns          Walk 10 feet activity   Assist  Walk 10 feet activity did not occur: Safety/medical concerns        Walk 50 feet activity   Assist Walk 50 feet with 2 turns activity did not occur: Safety/medical concerns         Walk 150 feet activity   Assist Walk 150 feet activity did not occur: Safety/medical concerns  Walk 10 feet on uneven surface  activity   Assist Walk 10 feet on uneven surfaces activity did not occur: Safety/medical concerns         Wheelchair     Assist Will patient use wheelchair at discharge?: Yes Type of Wheelchair: Manual    Wheelchair assist level: Supervision/Verbal cueing (therapist assisted w/mgmt of o2 tank) Max wheelchair distance: 150    Wheelchair 50 feet with 2 turns activity    Assist         Assist Level: Supervision/Verbal cueing   Wheelchair 150 feet activity     Assist      Assist Level: Supervision/Verbal cueing (mult brief rest breaks)   Blood pressure 124/85, pulse 82, temperature 97.9 F (36.6 C), temperature source Oral, resp. rate 16, SpO2 97 %.  Medical Problem List and Plan: 1.  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  Continue prednisone with taper currently at 40 mg twice daily.  Currently with 5 L oxygen nasal cannula             -patient may  shower             -ELOS/Goals:  Mod I to supervision 2-3 weeks CIR PT, OT, very deconditioned  2.  Antithrombotics: -DVT/anticoagulation: Bilateral lower extremity DVTs.  Patient will remain on Eliquis 5 mg twice daily for a minimum of 3 months total             -antiplatelet therapy: N/A 3. Pain Management: Hydrocodone 1 tablet every 6 hours as needed- pt said she took Percocet 10/325 mg QID at home- Added heat , Kpad 4. Mood: Clonazepam 0.5 mg 3 times daily, Paxil 40 mg daily, melatonin 6 mg nightly- will see if possible to wean  -would benefit from neuropsych referral to help deal with situational anxiety             -antipsychotic agents: N/A  5. Neuropsych: This patient is capable of making decisions on her own behalf. 6. Skin/Wound Care: Routine skin checks 7. Fluids/Electrolytes/Nutrition: Routine in and outs with follow-up chemistries 8.  Hypothyroidism.  Continue Synthroid 25 mcg Monday Wednesday Friday 9.  Hypertension.  Lasix 40 mg daily, metoprolol 50 mg twice daily Vitals:   12/07/19 2214 12/08/19 0406  BP:  124/85  Pulse: 81 82  Resp:  16  Temp:  97.9 F (36.6 C)  SpO2: 98% 97%  controlled 8/21 10.  Hyperlipidemia.  Lipitor 40 mg daily 11.  History of gastric bypass 2017.  Dietary follow-up 12.  History left TKA April 2021.  Weightbearing as tolerated- left knee actually hurts more than RIght  And needs TKR on R knee, per pt. 13. Grounds pass if portable O2 is  available    LOS: 4 days A FACE TO FACE EVALUATION WAS PERFORMED  Ranelle Oyster 12/08/2019, 12:08 PM

## 2019-12-09 ENCOUNTER — Inpatient Hospital Stay (HOSPITAL_COMMUNITY): Payer: Medicare Other | Admitting: Occupational Therapy

## 2019-12-09 ENCOUNTER — Inpatient Hospital Stay (HOSPITAL_COMMUNITY): Payer: Medicare Other

## 2019-12-09 NOTE — Progress Notes (Signed)
[facility] [place] PHYSICAL MEDICINE & REHABILITATION PROGRESS NOTE   Subjective/Complaints:  Pt apparently received extra prednisone doses yesterday as taper overlapped. (140mg  total per RN). Pharmacy has fixed taper. Pt without ill effects and aware.  ROS: Patient denies fever, rash, sore throat, blurred vision, nausea, vomiting, diarrhea, cough, shortness of breath or chest pain, joint or back pain, headache, or mood change.   Objective:   No results found. No results for input(s): WBC, HGB, HCT, PLT in the last 72 hours. No results for input(s): NA, K, CL, CO2, GLUCOSE, BUN, CREATININE, CALCIUM in the last 72 hours.  Intake/Output Summary (Last 24 hours) at 12/09/2019 1111 Last data filed at 12/09/2019 0900 Gross per 24 hour  Intake 480 ml  Output --  Net 480 ml     Physical Exam: Vital Signs Blood pressure 126/89, pulse 92, temperature 98.2 F (36.8 C), temperature source Axillary, resp. rate 18, SpO2 95 %.    Constitutional: No distress . Vital signs reviewed. HEENT: EOMI, oral membranes moist Neck: supple Cardiovascular: RRR without murmur. No JVD    Respiratory/Chest: CTA Bilaterally without wheezes or rales. Normal effort    GI/Abdomen: BS +, non-tender, non-distended Ext: no clubbing, cyanosis, or edema Psych: pleasant and cooperative, anxious Neurologic: Cranial nerves II through XII intact, motor strength is 4/5 in bilateral deltoid, bicep, tricep, grip, hip flexor, knee extensors, ankle dorsiflexor and plantar flexor, decreased LT in distal feet/toes Musculoskeletal: no pain with UE or LE active ROM  No joint swelling    Assessment/Plan: 1. Functional deficits secondary to CIM which require 3+ hours per day of interdisciplinary therapy in a comprehensive inpatient rehab setting.  Physiatrist is providing close team supervision and 24 hour management of active medical problems listed below.  Physiatrist and rehab team continue to assess barriers to discharge/monitor  patient progress toward functional and medical goals  Care Tool:  Bathing    Body parts bathed by patient: Front perineal area, Buttocks, Right upper leg, Right lower leg, Left upper leg, Left lower leg, Face   Body parts bathed by helper: Front perineal area, Buttocks, Right upper leg, Left upper leg, Right lower leg, Left lower leg Body parts n/a: Right arm, Left arm, Chest, Abdomen   Bathing assist Assist Level: Maximal Assistance - Patient 24 - 49%     Upper Body Dressing/Undressing Upper body dressing   What is the patient wearing?: Bra, Pull over shirt    Upper body assist Assist Level: Minimal Assistance - Patient > 75%    Lower Body Dressing/Undressing Lower body dressing      What is the patient wearing?: Pants, Underwear/pull up     Lower body assist Assist for lower body dressing: Maximal Assistance - Patient 25 - 49%     Toileting Toileting    Toileting assist Assist for toileting: Minimal Assistance - Patient > 75%     Transfers Chair/bed transfer  Transfers assist     Chair/bed transfer assist level: 2 Helpers 12/11/2019)     Locomotion Ambulation   Ambulation assist   Ambulation activity did not occur: Safety/medical concerns          Walk 10 feet activity   Assist  Walk 10 feet activity did not occur: Safety/medical concerns        Walk 50 feet activity   Assist Walk 50 feet with 2 turns activity did not occur: Safety/medical concerns         Walk 150 feet activity   Assist Walk 150 feet activity  did not occur: Safety/medical concerns         Walk 10 feet on uneven surface  activity   Assist Walk 10 feet on uneven surfaces activity did not occur: Safety/medical concerns         Wheelchair     Assist Will patient use wheelchair at discharge?: Yes Type of Wheelchair: Manual    Wheelchair assist level: Supervision/Verbal cueing (therapist assisted w/mgmt of o2 tank) Max wheelchair distance: 150     Wheelchair 50 feet with 2 turns activity    Assist        Assist Level: Supervision/Verbal cueing   Wheelchair 150 feet activity     Assist      Assist Level: Supervision/Verbal cueing (mult brief rest breaks)   Blood pressure 126/89, pulse 92, temperature 98.2 F (36.8 C), temperature source Axillary, resp. rate 18, SpO2 95 %.  Medical Problem List and Plan: 1.  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  Continue prednisone with taper. Doses adjusted by pharmacy yesterday. Currently 30mg  bid  -Currently with 5 L oxygen nasal cannula             -patient may  shower             -ELOS/Goals:  Mod I to supervision 2-3 weeks CIR PT, OT, pt motivated  2.  Antithrombotics: -DVT/anticoagulation: Bilateral lower extremity DVTs.  Patient will remain on Eliquis 5 mg twice daily for a minimum of 3 months total             -antiplatelet therapy: N/A 3. Pain Management: Hydrocodone 1 tablet every 6 hours as needed- pt said she took Percocet 10/325 mg QID at home- Added heat , Kpad 4. Mood: Clonazepam 0.5 mg 3 times daily, Paxil 40 mg daily, melatonin 6 mg nightly- will see if possible to wean  -would benefit from neuropsych referral to help deal with situational anxiety             -antipsychotic agents: N/A  5. Neuropsych: This patient is capable of making decisions on her own behalf. 6. Skin/Wound Care: Routine skin checks 7. Fluids/Electrolytes/Nutrition: Routine in and outs with follow-up chemistries 8.  Hypothyroidism.  Continue Synthroid 25 mcg Monday Wednesday Friday 9.  Hypertension.  Lasix 40 mg daily, metoprolol 50 mg twice daily Vitals:   12/09/19 0559 12/09/19 0836  BP: 126/89   Pulse: 92   Resp: 18   Temp: 98.2 F (36.8 C)   SpO2: 98% 95%  controlled 8/22 10.  Hyperlipidemia.  Lipitor 40 mg daily 11.  History of gastric bypass 2017.  Dietary follow-up 12.  History left TKA April 2021.  Weightbearing as tolerated- left knee actually  hurts more than RIght  And needs TKR on R knee, per pt. 13. Grounds pass if portable O2 is available    LOS: 5 days A FACE TO FACE EVALUATION WAS PERFORMED  May 2021 12/09/2019, 11:11 AM

## 2019-12-09 NOTE — Progress Notes (Signed)
Patient inquired about the dose of prednisone she took yesterday. MAR reviewed with Charge RN who in turn consulted with pharmacy. Pharmacist placed order and patient notified of action. Dr. Riley Kill made aware.

## 2019-12-09 NOTE — Progress Notes (Signed)
Occupational Therapy Session Note  Patient Details  Name: Bonnie Ballard MRN: [id number] Date of Birth: 11/07/1965  Today's Date: 12/09/2019 OT Individual Time: 440 [id number] and 7425-9563 OT Individual Time Calculation (min): 57 min and 43 min   Short Term Goals: Week 1:  OT Short Term Goal 1 (Week 1): Patient will complete sit to stand with use of Stedy and Mod A. OT Short Term Goal 2 (Week 1): Patient will complete UB dressing seated EOB with supervision A. OT Short Term Goal 3 (Week 1): Patient will complete LB bathing at bed level with Mod A and use of AE PRN. OT Short Term Goal 4 (Week 1): Patient will complete transfer to [facility] with Mod A and LRAD.  Skilled Therapeutic Interventions/Progress Updates:    Pt greeted in bed with no c/o pain, just requesting her anxiety medication. RN in during session to provide this and OT added a lavender scented cotton ball to her pillow at start of tx. Pt was reporting SOB at rest, noted 02 sats 85-86% on 5.5L. Bumped her up to 6L initially prior to activity. Then set her up for LB bathing/dressing tasks while bedlevel with HOB raised. She used the LH sponge to wash her lower legs with Min A. She utilized reclined figure 4 position to don her ankle socks and thread LEs into underwear and pants. Note that she required several rest breaks to meet demands of stated tasks, 02 sats decreasing to 79-80%, requiring >30 seconds to increase to 90-93%. Bumped her up to 6.5L as she still felt very SOB. She completed perihygiene and elevated underwear over hips using rolls, pt once again requiring significantly increased time for rest breaks, 02 sats in high 70s when she took rest breaks with HOB elevated, increasing to 90-91% in ~40-50 seconds. She wanted to pull pants over hips while EOB using leans as she reported this may be easier. Supine<sit completed unassisted and then pt required a prolonged rest break. Had to bump her up to 7L at this point as sats remained in 84-86%  range EOB. After ~1 minute 30 seconds sats increased to 90% but then waffled between 89-90%, pt still with reports of SOB, concerned about it. Notified RN who called RT. OT provided Max A to elevate pants over hips and then left her in care of RN and RT for further assessment.   2nd Session 1:1 tx (43 min) Pt greeted in her w/c with no c/o pain, motivated to participate in tx, requesting to work on UB strengthening. 02 sats at rest 91% on 5.5L 02. Bumped her up to 6L in prep for activity today. Escorted pt via w/c to the dayroom and played meaningful music to increase PNS activity/ease of breathing. Guided pt first through UB stretches with emphasis placed on coordinating breathing with movement, shoulder rolls, shoulder retraction, scapular pinches, wrist rolls x10 reps. Transitioned to UE therapeutic exercise using 2# dowel rod, education provided on breathing out with exertion, 10 reps each exercise. 02 sats during rest 88-93%, when sats were 88% pt able to increase to 90% in less than 10 seconds. Educated pt on forward posture breathing positions with hands on knees and also with elbows propped up on table. Pt described posture with elbow on table as "awesome," 02 sats 97% after a few minutes of engagement. Continued education regarding inclusion of relaxation/deep breathing techniques throughout the day to help with managing anxiety and overall breathing, lavender provided for aromatherapy. At end of session pt was returned to the room  and left with all needs within reach, satting at 92% back on room 02.     Therapy Documentation Precautions:  Precautions Precautions: Fall, Other (comment) Precaution Comments: 5-7L via [place] Restrictions Weight Bearing Restrictions: No Vital Signs: Oxygen Therapy SpO2: 95 % O2 Device: Nasal Cannula O2 Flow Rate (L/min): 5 L/min Pain:   ADL: ADL Eating: Independent Where Assessed-Eating: Bed level Grooming: Setup Where Assessed-Grooming: Edge of bed Upper  Body Bathing: Minimal assistance Where Assessed-Upper Body Bathing: Bed level Lower Body Bathing: Moderate assistance Where Assessed-Lower Body Bathing: Bed level Upper Body Dressing: Minimal assistance Where Assessed-Upper Body Dressing: Bed level Lower Body Dressing: Maximal assistance Where Assessed-Lower Body Dressing: Bed level Toileting: Maximal assistance Where Assessed-Toileting: Bed level Toilet Transfer: Maximal assistance Toilet Transfer Method: Other (comment) Antony Salmon) Toilet Transfer Equipment: Animator Transfer: Unable to assess      Therapy/Group: Individual Therapy  Duane Earnshaw A Rebekah Zackery 12/09/2019, 12:32 PM

## 2019-12-09 NOTE — Progress Notes (Signed)
Physical Therapy Session Note  Patient Details  Name: Bonnie Ballard MRN: [id number] Date of Birth: May 10, 1965  Today's Date: 12/09/2019 PT Individual Time:1300- 1400  - ; 1535-1605;  60 min   And 30 min     Short Term Goals: Week 1:  PT Short Term Goal 1 (Week 1): Pt will perform bed mobility with min assist consistently PT Short Term Goal 2 (Week 1): Pt will initiate gait training in parallel bars PT Short Term Goal 3 (Week 1): Pt will propell WC 160ft with supervision assist PT Short Term Goal 4 (Week 1): pt will trasnfer to and from [facility] with SB and mod assist      Skilled Therapeutic Interventions/Progress Updates:  tx 1:  Pt sitting up in wc.  Pt rated pain bil knees 2/10, premedicated.   Pt on 6L O2 via bottle, [name].  W/c propulsion using bil UEs over level tile, x 50' before SOB. O2 sats 98%.  Endurance activity, using Kinetron from w/c level, resistance 40 cm/sec x 10 , 20 cycles targeting gluteal muscles in trunk flexion, x 30 cycles targeting quads. Pt desaturated to 90 % O2 with activity.  She recovered with cues for slow deep breathing, in approx 1 minute.  Attempted sit> stand in parallel bars with pt pulling up on bars, but unable due to LE weakness.  Sit> stand in bariatric Stedy with Airex mat on shin guard due to knee OA;  max assist.  Pt dizzy upon high sitting on seat; this resolved when she leaned forward towards bar.  Pt desaturated to 84% on 6L O2 with this activity; PT increased O2 to 8L.min and O2 sats increased slowly to 90%.   +2 to assist pt back into sitting in w/c.  At end of session, pt seated in w/c with needs at hand and seat belt alarm set. PT set O2 at 5L/min through wall and informed [name], NT who was entering room that pt may need more O2 due to recent activity.  tx 2:  Pt seated in w/c, on 5L O2.  She denied pain.  She reuqested using BSC; she had stress incontinence as she coughed.  +2 for slide board transfer, level to [facility].  Pt continent of urine.  Pt  cleaned peri area with set up, including wiping off LEs.  PT doffed mesh panties and pants, and donned shorts.  +2 to use SB to return to w/c.   At end of session, pt seated in w/c with needs at hand and seat belt alarm set.         Therapy Documentation Precautions:  Precautions Precautions: Fall, Other (comment) Precaution Comments: 5-7L via [name] Restrictions Weight Bearing Restrictions: No            Therapy/Group: Individual Therapy  [name] 12/09/2019, 12:25 PM

## 2019-12-10 ENCOUNTER — Inpatient Hospital Stay (HOSPITAL_COMMUNITY): Payer: Medicare Other | Admitting: Occupational Therapy

## 2019-12-10 ENCOUNTER — Encounter (HOSPITAL_COMMUNITY): Payer: Medicare Other | Admitting: Psychology

## 2019-12-10 ENCOUNTER — Inpatient Hospital Stay (HOSPITAL_COMMUNITY): Payer: Medicare Other

## 2019-12-10 DIAGNOSIS — R5381 Other malaise: Secondary | ICD-10-CM

## 2019-12-10 NOTE — Progress Notes (Signed)
Physical Therapy Session Note  Patient Details  Name: Bonnie Ballard MRN: [id number] Date of Birth: Feb 19, 1966  Today's Date: 12/10/2019 PT Individual Time: 7622-6333 PT Individual Time Calculation (min): 54 min   Short Term Goals: Week 1:  PT Short Term Goal 1 (Week 1): Pt will perform bed mobility with min assist consistently PT Short Term Goal 2 (Week 1): Pt will initiate gait training in parallel bars PT Short Term Goal 3 (Week 1): Pt will propell WC 18ft with supervision assist PT Short Term Goal 4 (Week 1): pt will trasnfer to and from [facility] with SB and mod assist Week 2:    Week 3:     Skilled Therapeutic Interventions/Progress Updates:    PAIN denies pain  Pt initially oob in wc and states she had a "bad day" yesterday.  Pt transported to gym for session w/focus on strength, endurance, mobility.    Seated LAQs x 15 as warm up in prep for standing.  STS in parallel bars w/max assit, therapist in front of patient and encourageing forward wt shift w/transition and facilitating hip and knee extension, blocking knees in standing, pt uses bars to assist w/sts. In standing, pt works on quad and [employer], very difficult for pt.  Repeated x 3    Stood 1 min x 3 02sats 87% briefly, recover to 90 in 1 min, continues to increase to 100% gradually over 2 -3 min.  Recovers several min between efforts. Pt very excited about performance w/standing today.   wc propulsion x 196ft w/bilat UEs. For endurance challenge.  wc to bsc SBT w/mod assist of 1 and set up of wc/board for transfer.  Pt able to lean side to side for therapist to lower pants/brief.  Pt handed off to NT at end of session.  Therapy Documentation Precautions:  Precautions Precautions: Fall, Other (comment) Precaution Comments: 5-7L via [place] Restrictions Weight Bearing Restrictions: No    Therapy/Group: Individual Therapy  Rada Hay, PT   Bonnie Ballard 12/10/2019, 12:55 PM

## 2019-12-10 NOTE — Plan of Care (Signed)
°  Problem: Consults Goal: RH GENERAL PATIENT EDUCATION Description: See Patient Education module for education specifics. Outcome: Progressing   Problem: RH BOWEL ELIMINATION Goal: RH STG MANAGE BOWEL WITH ASSISTANCE Description: STG Manage Bowel with sup Assistance. Outcome: Progressing   Problem: RH BLADDER ELIMINATION Goal: RH STG MANAGE BLADDER WITH ASSISTANCE Description: STG Manage Bladder With Supv Assistance Outcome: Progressing   Problem: RH SKIN INTEGRITY Goal: RH STG SKIN FREE OF INFECTION/BREAKDOWN Description: Skin will be free of infection/breakdown with min assist Outcome: Progressing Goal: RH STG MAINTAIN SKIN INTEGRITY WITH ASSISTANCE Description: STG Maintain Skin Integrity With min Assistance. Outcome: Progressing   Problem: RH SAFETY Goal: RH STG ADHERE TO SAFETY PRECAUTIONS W/ASSISTANCE/DEVICE Description: STG Adhere to Safety Precautions With MIN Assistance/Device. Outcome: Progressing   Problem: RH PAIN MANAGEMENT Goal: RH STG PAIN MANAGED AT OR BELOW PT'S PAIN GOAL Description: Pain will be managed at 3 out of 10 at MOD I  Outcome: Progressing   Problem: RH KNOWLEDGE DEFICIT GENERAL Goal: RH STG INCREASE KNOWLEDGE OF SELF CARE AFTER HOSPITALIZATION Description: Pt will be able to verbalize self care related to handouts, therapy, and personalization for independent care with min assist  Outcome: Progressing   

## 2019-12-10 NOTE — Progress Notes (Signed)
[facility] PHYSICAL MEDICINE & REHABILITATION PROGRESS NOTE   Subjective/Complaints:  No issues overnite flet anxious and flushed yesterday , doing better today , discussed prednisone effect   ROS: Patient without CP, SOB, N/V/D  Objective:   No results found. No results for input(s): WBC, HGB, HCT, PLT in the last 72 hours. No results for input(s): NA, K, CL, CO2, GLUCOSE, BUN, CREATININE, CALCIUM in the last 72 hours.  Intake/Output Summary (Last 24 hours) at 12/10/2019 0818 Last data filed at 12/10/2019 0814 Gross per 24 hour  Intake 720 ml  Output --  Net 720 ml     Physical Exam: Vital Signs Blood pressure 133/75, pulse 89, temperature 98.2 F (36.8 C), temperature source Oral, resp. rate 18, SpO2 92 %.    Constitutional: No distress . Vital signs reviewed. HEENT: EOMI, oral membranes moist Neck: supple Cardiovascular: RRR without murmur. No JVD    Respiratory/Chest: CTA Bilaterally without wheezes or rales. Normal effort    GI/Abdomen: BS +, non-tender, non-distended Ext: no clubbing, cyanosis, or edema Psych: pleasant and cooperative, anxious Neurologic: Cranial nerves II through XII intact, motor strength is 4/5 in bilateral deltoid, bicep, tricep, grip, hip flexor, knee extensors, ankle dorsiflexor and plantar flexor, decreased LT in distal feet/toes Musculoskeletal: no pain with UE or LE active ROM  No joint swelling    Assessment/Plan: 1. Functional deficits secondary to CIM which require 3+ hours per day of interdisciplinary therapy in a comprehensive inpatient rehab setting.  Physiatrist is providing close team supervision and 24 hour management of active medical problems listed below.  Physiatrist and rehab team continue to assess barriers to discharge/monitor patient progress toward functional and medical goals  Care Tool:  Bathing    Body parts bathed by patient: Front perineal area, Buttocks, Right upper leg, Right lower leg, Left upper leg, Left  lower leg, Face   Body parts bathed by helper: Front perineal area, Buttocks, Right upper leg, Left upper leg, Right lower leg, Left lower leg Body parts n/a: Right arm, Left arm, Chest, Abdomen   Bathing assist Assist Level: Maximal Assistance - Patient 24 - 49%     Upper Body Dressing/Undressing Upper body dressing   What is the patient wearing?: Bra, Pull over shirt    Upper body assist Assist Level: Minimal Assistance - Patient > 75%    Lower Body Dressing/Undressing Lower body dressing      What is the patient wearing?: Pants, Underwear/pull up     Lower body assist Assist for lower body dressing: Maximal Assistance - Patient 25 - 49%     Toileting Toileting    Toileting assist Assist for toileting: Minimal Assistance - Patient > 75%     Transfers Chair/bed transfer  Transfers assist     Chair/bed transfer assist level: 2 Helpers Occupational hygienist)     Locomotion Ambulation   Ambulation assist   Ambulation activity did not occur: Safety/medical concerns          Walk 10 feet activity   Assist  Walk 10 feet activity did not occur: Safety/medical concerns        Walk 50 feet activity   Assist Walk 50 feet with 2 turns activity did not occur: Safety/medical concerns         Walk 150 feet activity   Assist Walk 150 feet activity did not occur: Safety/medical concerns         Walk 10 feet on uneven surface  activity   Assist Walk 10 feet on uneven  surfaces activity did not occur: Safety/medical concerns         Wheelchair     Assist Will patient use wheelchair at discharge?: Yes Type of Wheelchair: Manual    Wheelchair assist level: Supervision/Verbal cueing Max wheelchair distance: 50    Wheelchair 50 feet with 2 turns activity    Assist        Assist Level: Supervision/Verbal cueing   Wheelchair 150 feet activity     Assist      Assist Level: Supervision/Verbal cueing (mult brief rest breaks)   Blood  pressure 133/75, pulse 89, temperature 98.2 F (36.8 C), temperature source Oral, resp. rate 18, SpO2 92 %.  Medical Problem List and Plan: 1.  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  Continue prednisone with taper. Doses adjusted by pharmacy yesterday. Currently 30mg  bid  -Currently with 5 L oxygen nasal cannula             -patient may  shower             -ELOS/Goals:  Mod I to supervision 2-3 weeks CIR PT, OT, pt motivated  2.  Antithrombotics: -DVT/anticoagulation: Bilateral lower extremity DVTs.  Patient will remain on Eliquis 5 mg twice daily for a minimum of 3 months total             -antiplatelet therapy: N/A 3. Pain Management: Hydrocodone 1 tablet every 6 hours as needed- pt said she took Percocet 10/325 mg QID at home- Added heat , Kpad 4. Mood: Clonazepam 0.5 mg 3 times daily, Paxil 40 mg daily, melatonin 6 mg nightly- will see if possible to wean  -would benefit from neuropsych referral to help deal with situational anxiety             -antipsychotic agents: N/A  5. Neuropsych: This patient is capable of making decisions on her own behalf. 6. Skin/Wound Care: Routine skin checks 7. Fluids/Electrolytes/Nutrition: Routine in and outs with follow-up chemistries 8.  Hypothyroidism.  Continue Synthroid 25 mcg Monday Wednesday Friday 9.  Hypertension.  Lasix 40 mg daily, metoprolol 50 mg twice daily Vitals:   12/09/19 1924 12/10/19 0552  BP: 114/81 133/75  Pulse: (!) 109 89  Resp: 20 18  Temp: 98.2 F (36.8 C) 98.2 F (36.8 C)  SpO2: 95% 92%  controlled 8/23 10.  Hyperlipidemia.  Lipitor 40 mg daily 11.  History of gastric bypass 2017.  Dietary follow-up 12.  History left TKA April 2021.  Weightbearing as tolerated- left knee actually hurts more than RIght  And needs TKR on R knee, per pt. 13. Grounds pass if portable O2 is available    LOS: 6 days A FACE TO FACE EVALUATION WAS PERFORMED  May 2021 12/10/2019, 8:18 AM

## 2019-12-10 NOTE — Progress Notes (Signed)
Occupational Therapy Session Note  Patient Details  Name: Shirelle Tootle MRN: [id number] Date of Birth: 1965/09/21  Today's Date: 12/10/2019 OT Individual Time: 4854-6270 and 3500-9381 OT Individual Time Calculation (min): 56 min and 75 min  Short Term Goals: Week 1:  OT Short Term Goal 1 (Week 1): Patient will complete sit to stand with use of Stedy and Mod A. OT Short Term Goal 2 (Week 1): Patient will complete UB dressing seated EOB with supervision A. OT Short Term Goal 3 (Week 1): Patient will complete LB bathing at bed level with Mod A and use of AE PRN. OT Short Term Goal 4 (Week 1): Patient will complete transfer to [facility] with Mod A and LRAD.  Skilled Therapeutic Interventions/Progress Updates:    Pt greeted in bed, just finished with neuropsych visit. Pt stated her anxiety was "through the roof" and requesting for OT to notify RN to receive her morning medication. OT did so, then set pt up with lavender for her pillowcase to promote relaxation while she engaged in UB bathing/dressing tasks. She wanted to take it easy this AM so just opted to perform B/D for UB at this time. Vcs for mgt of [place] during dressing. Pt able to sit up in bed to pull shirt down over trunk unassisted. Transitioned to hand strengthening using yellow theraputty and exercise printout. She reported being frustrated regarding lacking enough hand strength to use the inhaler herself. Pt followed instruction for hand exercises with cuing, education emphasis placed on breathing out with exertion. 02 sats on room 02 throughout session 90-92% and sometimes dipping to 88% after activity, increasing within ~20 seconds back to 90%. At end of session pt remained in bed with all needs within reach and bed alarm set.   2nd Session 1:1 tx (75 min) Pt greeted in the w/c with no c/o pain, incredibly proud that she stood at the parallel bars today! We celebrated! Continued providing emotional support and encouragement regarding LTG  achievement. She wanted to get off of the unit for tx today. Escorted her to the atrium and worked on [employer] by propelling the w/c. Pt required multiple seated rest breaks due to limited activity tolerance. 02 sats frequently assessed with reading of 89-92% on 6L. Transitioned to meaningful occupation-based activity of dancing while w/c level in her room. Guided pt through UB and LB exercises while listening to her favorite music, pt also singing out loud to work on breath control. 02 sats 91-95% when assessed! We celebrated once again! Education continued regarding using diaphragmatic breathing strategies and breathing out with exertion during functional activity. Pt receptive and eager for education. Left her sitting up in the w/c with all needs within reach.       Therapy Documentation Precautions:  Precautions Precautions: Fall, Other (comment) Precaution Comments: 5-7L via [place] Restrictions Weight Bearing Restrictions: No Vital Signs: Oxygen Therapy SpO2: 92 % O2 Device: Nasal Cannula O2 Flow Rate (L/min): 5 L/min Pain: Pain Assessment Pain Scale: 0-10 Pain Score: 0-No pain Pain Type: Chronic pain Pain Location: Back Pain Orientation: Lower Pain Descriptors / Indicators: Aching Pain Onset: On-going Patients Stated Pain Goal: 2 Pain Intervention(s): Medication (See eMAR) Multiple Pain Sites: No ADL: ADL Eating: Independent Where Assessed-Eating: Bed level Grooming: Setup Where Assessed-Grooming: Edge of bed Upper Body Bathing: Minimal assistance Where Assessed-Upper Body Bathing: Bed level Lower Body Bathing: Moderate assistance Where Assessed-Lower Body Bathing: Bed level Upper Body Dressing: Minimal assistance Where Assessed-Upper Body Dressing: Bed level Lower Body Dressing: Maximal assistance  Where Assessed-Lower Body Dressing: Bed level Toileting: Maximal assistance Where Assessed-Toileting: Bed level Toilet Transfer: Maximal assistance Toilet Transfer  Method: Other (comment) Antony Salmon) Toilet Transfer Equipment: Bedside commode Tub/Shower Transfer: Unable to assess     Therapy/Group: Individual Therapy  Kourtney Terriquez A Reuel Lamadrid 12/10/2019, 12:17 PM

## 2019-12-10 NOTE — Consult Note (Signed)
Neuropsychological Consultation   Patient:   Bonnie Ballard   DOB:   08-19-1965  MR Number:  [id number]  Location:  [facility] [address] CENTER A [address] [id number] [place] [address] Dept: [phone] Loc: [phone]           Date of Service:   12/10/2019  Start Time:   8 AM End Time:   9 AM  Provider/Observer:  Arley Phenix, Psy.D.       Clinical Neuropsychologist       Billing Code/Service: 29518  Chief Complaint:    Bonnie Ballard is a 54 year old female with history of gastric bypass 2017, hypertension, sarcoidosis, hyperlipidemia, hypothyroidism, left partial TKA April 2021.  Patient presented on 09/23/2019 to [facility] with COVID-19 pneumonia developed ARDS requiring BiPAP and narrowly avoiding intubation.  Patient was maintained on Solu-Medrol.  She was weaned down to high flow oxygen.  Hospital course has been complicated by bilateral DVTs and chest CT negative for pulmonary emboli.  Patient was placed on Eliquis for DVTs.  Hospital course bouts of insomnia maintained on melatonin as well as anxiety with Klonopin 3 times a day.  The patient had a recent exacerbation of anxiety developing full-blown panic attacks.  The patient had never had panic attacks prior.  Recently, changes in prednisone resulted in the patient having a higher dose of prednisone which very likely was a culprit for the worsening anxiety and panic.  Reason for Service:  The patient was referred for neuropsychological consultation due to anxiety and panic symptoms.  However, this is likely secondary to changes in prednisone and her medication regimen has been adjusted.  Below is the HPI for the current admission.    HPI: Bonnie Ballard is a 54 year old right-handed female history of gastric bypass 2017, hypertension, sarcoidosis, hyperlipidemia, hypothyroidism, left partial TKA April 2021.  Per chart review she lives with her 2 adult aged  daughters in [place].  Daughters assist as needed.  To level home bed and bath on main level 2 steps to entry.  Reportedly independent prior to admission.  Presented to outside hospital 09/23/2019 [facility] with COVID-19 pneumonia developed ARDS requiring BiPAP narrowly avoiding intubation and was maintained on Solu-Medrol.  She was weaned down to high flow oxygen.  Hospital course complicated by bilateral DVTs and chest CT negative for pulmonary emboli.  She was placed on Eliquis for DVT.  Hospital course bouts of insomnia maintained on melatonin as well as anxiety with Klonopin 3 times daily.  She is tolerating a regular diet.  Current Status:  The patient was alert and sitting up in her hospital bed when I entered the room.  She just finished with the attending.  The patient is continuing to get oxygen but is making significant recovery/improvements from COVID-19.  The patient reports that her panic attacks have stopped and that her anxiety is improving.  She reports she did have some mild anxiety over time but this is been a significant increase in anxiety symptoms with full-blown panic attacks.  The patient denies ever having a panic attack prior.  It does look like this change in mood state was directly related to response to prednisone.  Behavioral Observation: Sigrid Schwebach  presents as a 54 y.o.-year-old Right Caucasian Female who appeared her stated age. her dress was Appropriate and she was Well Groomed and her manners were Appropriate to the situation.  her participation was indicative of Appropriate and Attentive behaviors.  There  were any physical disabilities noted.  she displayed an appropriate level of cooperation and motivation.     Interactions:    Active Appropriate and Attentive  Attention:   within normal limits and attention span and concentration were age appropriate  Memory:   within normal limits; recent and remote memory intact  Visuo-spatial:  not  examined  Speech (Volume):  normal  Speech:   normal; normal  Thought Process:  Coherent and Relevant  Though Content:  WNL; not suicidal and not homicidal  Orientation:   person, place, time/date and situation  Judgment:   Fair  Planning:   Fair  Affect:    Anxious  Mood:    Anxious with significant improvements in mood state recently.  Insight:   Good  Intelligence:   normal  Medical History:  No past medical history on file.          Abuse/Trauma History: Patient has multiple traumatic experience with her significant/critical illness secondary to COVID-19 pneumonia and extended hospital stay.  Patient has had DVTs along with other secondary complications to COVID-19.  Psychiatric History:  No significant prior psychiatric history beyond some mild issues with anxiety in the past.  Family Med/Psych History: No family history on file.  Impression/DX:  Bonnie Ballard is a 54 year old female with history of gastric bypass 2017, hypertension, sarcoidosis, hyperlipidemia, hypothyroidism, left partial TKA April 2021.  Patient presented on 09/23/2019 to [facility] with COVID-19 pneumonia developed ARDS requiring BiPAP and narrowly avoiding intubation.  Patient was maintained on Solu-Medrol.  She was weaned down to high flow oxygen.  Hospital course has been complicated by bilateral DVTs and chest CT negative for pulmonary emboli.  Patient was placed on Eliquis for DVTs.  Hospital course bouts of insomnia maintained on melatonin as well as anxiety with Klonopin 3 times a day.  The patient had a recent exacerbation of anxiety developing full-blown panic attacks.  The patient had never had panic attacks prior.  Recently, changes in prednisone resulted in the patient having a higher dose of prednisone which very likely was a culprit for the worsening anxiety and panic.  The patient was alert and sitting up in her hospital bed when I entered the room.  She just finished with the attending.   The patient is continuing to get oxygen but is making significant recovery/improvements from COVID-19.  The patient reports that her panic attacks have stopped and that her anxiety is improving.  She reports she did have some mild anxiety over time but this is been a significant increase in anxiety symptoms with full-blown panic attacks.  The patient denies ever having a panic attack prior.  It does look like this change in mood state was directly related to response to prednisone.  Diagnosis:    Debility        Electronically Signed   _______________________ Arley Phenix, Psy.D.

## 2019-12-11 ENCOUNTER — Inpatient Hospital Stay (HOSPITAL_COMMUNITY): Payer: Medicare Other | Admitting: Occupational Therapy

## 2019-12-11 ENCOUNTER — Inpatient Hospital Stay (HOSPITAL_COMMUNITY): Payer: Medicare Other | Admitting: Physical Therapy

## 2019-12-11 LAB — ECHOCARDIOGRAM COMPLETE
Area-P 1/2: 4.6 cm2
S' Lateral: 2.5 cm

## 2019-12-11 NOTE — Progress Notes (Signed)
Physical Therapy Session Note  Patient Details  Name: Bonnie Ballard MRN: [id number] Date of Birth: 11-02-1965  Today's Date: 12/11/2019 PT Individual Time: 9735-3299 PT Individual Time Calculation (min): 32 min   Short Term Goals: Week 1:  PT Short Term Goal 1 (Week 1): Pt will perform bed mobility with min assist consistently PT Short Term Goal 2 (Week 1): Pt will initiate gait training in parallel bars PT Short Term Goal 3 (Week 1): Pt will propell WC 126ft with supervision assist PT Short Term Goal 4 (Week 1): pt will trasnfer to and from [facility] with SB and mod assist  Skilled Therapeutic Interventions/Progress Updates:    Pt received supine in bed and agreeable to therapy session though reports significant fatigue. Pt states "I almost didn't make it back into the bed" referring to at the end of her last therapy session. Despite this pt motivated to perform bed level exercises. Performed the following on B LEs: - ankle DF against level 2 theraband resistance 2x20 reps  - ankle PF against level 2 theraband resistance 2x20reps  - heel slides 2x10 reps Therapist providing cuing for proper form/technique throughout and educated pt on performing these exercises outside of therapy sessions (without resistance as pt would be unable to set-up theraband). Pt becomes tearful and expresses happiness/gratefulness during session talking about how she is thankful to have survived and made it to her birthday today - therapist provided emotional support and encouragement. Pt left supine in bed with needs in reach and bed alarm on.  RN notified to assist pt with retrieving clothes from laundry as pt unable to participate in that task during therapy session due to fatigue.    Therapy Documentation Precautions:  Precautions Precautions: Fall, Other (comment) Precaution Comments: 5-7L via [place] Restrictions Weight Bearing Restrictions: No  Pain:   No reports of pain throughout session.   Therapy/Group:  Individual Therapy  Ginny Forth , PT, DPT, CSRS  12/11/2019, 6:50 PM

## 2019-12-11 NOTE — Progress Notes (Signed)
Physical Therapy Session Note  Patient Details  Name: Bonnie Ballard MRN: [id number] Date of Birth: March 30, 1966  Today's Date: 12/11/2019 PT Individual Time: 4403-4742 and 1420-1455 PT Individual Time Calculation (min): 58 min   Short Term Goals: Week 1:  PT Short Term Goal 1 (Week 1): Pt will perform bed mobility with min assist consistently PT Short Term Goal 2 (Week 1): Pt will initiate gait training in parallel bars PT Short Term Goal 3 (Week 1): Pt will propell WC 169f with supervision assist PT Short Term Goal 4 (Week 1): [facility]  Skilled Therapeutic Interventions/Progress Updates: Tx1: Pt presented in bed agreeable to therapy. Pt states some mild soreness in B hands and B thighs but premedicated and indicated aware from previous day's therapy sessions. Pt performed bed mobility with supervision and use of bed features and after brief rest donned shoes with set up. Pt then performed SB transfer with PTA setting up SB and pt performing with close S and increased time. Pt then propelled w/c to sink and performed oral hygiene mod I and brushed hair. Pt agreeable to attempting therapy outside as it is pt's birthday as well as to see if she can tolerate outside air (pt has not been outside since getting sick). Pt propelled >2049fwith supervision before requiring a break, SpO2 94% after propulsion with brief drop to 91%. [facility] pt spending approx 20 min outside without sensing SOB and was able to participate in seated LE therex as follows: ankle pumps, LAQ, hip flexion, hip abd/add. All activities performed to fatigue 15-20 reps bilaterally. Pt becoming labile during session with PTA providing emotional support due to pt having not been outside in >3 mos. Pt then transported back to unit/room at end of session and remained in w/c with call bell within reach and needs met.   Tx2: Pt presented in w/c agreeable  to therapy however pt indicating significant fatigue. Pt exhibiting some dyspnea however SpO2 92%. As pt has another therapy session this day agreeable to propel to laundry room (pt has clothes in washer) then return to room and transfer to bed. Pt was able to propel to [place] room with supervision and without rest break, PTA moved clothes over for energy conservation and then pt propelled back to room requiring x 1 rest break due to fatigue. Once in room pt required increased time due to fatigue to appropriately align w/c to bed for transfer to bed. Pt was ultimately able to perform SB transfer back into bed with CGA and increased time, SpO2 >90% with activity. Pt ultimately required minA for BLE management for sit to supine transfer. Pt was able to reposition self to comfort and left with bed alarm on, call bell within reach and needs met.      Therapy Documentation Precautions:  Precautions Precautions: Fall, Other (comment) Precaution Comments: 5-7L via [place] Restrictions Weight Bearing Restrictions: No General: PT Amount of Missed Time (min): 10 Minutes PT Missed Treatment Reason: Patient fatigue Vital Signs: Therapy Vitals Pulse Rate: 93 Resp: 16 BP: 116/88 Patient Position (if appropriate): Sitting Oxygen Therapy SpO2: 99 % O2 Device: Nasal Cannula Pain: Pain Assessment Pain Scale: 0-10 Pain Score: 6  Pain Type: Chronic pain Pain Location: Back Pain Orientation: Lower Pain Descriptors / Indicators: Sore Pain Frequency: Intermittent Pain Onset: On-going Patients Stated Pain Goal: 2 Pain Intervention(s): Medication (See eMAR) Multiple Pain Sites: No Mobility:   Locomotion :  Trunk/Postural Assessment :    Balance:   Exercises:   Other Treatments:      Therapy/Group: Individual Therapy  Arial Galligan 12/11/2019, 3:51 PM

## 2019-12-11 NOTE — Progress Notes (Signed)
[place] PHYSICAL MEDICINE & REHABILITATION PROGRESS NOTE   Subjective/Complaints:  No issues overnite   ROS: Patient without CP, SOB, N/V/D  Objective:   No results found. No results for input(s): WBC, HGB, HCT, PLT in the last 72 hours. No results for input(s): NA, K, CL, CO2, GLUCOSE, BUN, CREATININE, CALCIUM in the last 72 hours.  Intake/Output Summary (Last 24 hours) at 12/11/2019 0728 Last data filed at 12/10/2019 2119 Gross per 24 hour  Intake 859 ml  Output --  Net 859 ml     Physical Exam: Vital Signs Blood pressure 136/89, pulse 88, temperature 97.7 F (36.5 C), resp. rate 18, SpO2 91 %.  General: No acute distress Mood and affect are appropriate Heart: Regular rate and rhythm no rubs murmurs or extra sounds Lungs: Clear to auscultation, breathing unlabored, no rales or wheezes Abdomen: Positive bowel sounds, soft nontender to palpation, nondistended Extremities: No clubbing, cyanosis, or edema Skin: No evidence of breakdown, no evidence of rash  Neurologic motor strength is 4/5 in bilateral deltoid, bicep, tricep, grip, hip flexor, knee extensors, ankle dorsiflexor and plantar flexor, decreased LT in distal feet/toes Musculoskeletal: no pain with UE or LE active ROM  No joint swelling    Assessment/Plan: 1. Functional deficits secondary to CIM which require 3+ hours per day of interdisciplinary therapy in a comprehensive inpatient rehab setting.  Physiatrist is providing close team supervision and 24 hour management of active medical problems listed below.  Physiatrist and rehab team continue to assess barriers to discharge/monitor patient progress toward functional and medical goals  Care Tool:  Bathing    Body parts bathed by patient: Front perineal area, Buttocks, Right upper leg, Right lower leg, Left upper leg, Left lower leg, Face   Body parts bathed by helper: Front perineal area, Buttocks, Right upper leg, Left upper leg, Right lower leg, Left  lower leg Body parts n/a: Right arm, Left arm, Chest, Abdomen   Bathing assist Assist Level: Maximal Assistance - Patient 24 - 49%     Upper Body Dressing/Undressing Upper body dressing   What is the patient wearing?: Pull over shirt    Upper body assist Assist Level: Supervision/Verbal cueing    Lower Body Dressing/Undressing Lower body dressing      What is the patient wearing?: Pants, Underwear/pull up     Lower body assist Assist for lower body dressing: Maximal Assistance - Patient 25 - 49%     Toileting Toileting    Toileting assist Assist for toileting: Minimal Assistance - Patient > 75%     Transfers Chair/bed transfer  Transfers assist     Chair/bed transfer assist level: 2 Helpers Occupational hygienist)     Locomotion Ambulation   Ambulation assist   Ambulation activity did not occur: Safety/medical concerns          Walk 10 feet activity   Assist  Walk 10 feet activity did not occur: Safety/medical concerns        Walk 50 feet activity   Assist Walk 50 feet with 2 turns activity did not occur: Safety/medical concerns         Walk 150 feet activity   Assist Walk 150 feet activity did not occur: Safety/medical concerns         Walk 10 feet on uneven surface  activity   Assist Walk 10 feet on uneven surfaces activity did not occur: Safety/medical concerns         Wheelchair     Assist Will patient use wheelchair  at discharge?: Yes Type of Wheelchair: Manual    Wheelchair assist level: Supervision/Verbal cueing Max wheelchair distance: 100    Wheelchair 50 feet with 2 turns activity    Assist        Assist Level: Supervision/Verbal cueing   Wheelchair 150 feet activity     Assist      Assist Level: Supervision/Verbal cueing (mult brief rest breaks)   Blood pressure 136/89, pulse 88, temperature 97.7 F (36.5 C), resp. rate 18, SpO2 91 %.  Medical Problem List and Plan: 1.  Critical illness myopathy  secondary to COVID-19/multimedical   .  Continue prednisone with taper. Doses adjusted by pharmacy yesterday. Currently 30mg  bid  -Currently with 5 L oxygen nasal cannula             -patient may  shower             -ELOS/Goals:  Mod I to supervision 2-3 weeks CIR PT, OT, pt motivated  2.  Antithrombotics: -DVT/anticoagulation: Bilateral lower extremity DVTs.  Patient will remain on Eliquis 5 mg twice daily for a minimum of 3 months total             -antiplatelet therapy: N/A 3. Pain Management: Hydrocodone 1 tablet every 6 hours as needed- pt said she took Percocet 10/325 mg QID at home- Added heat , Kpad 4. Mood: Clonazepam 0.5 mg 3 times daily, Paxil 40 mg daily, melatonin 6 mg nightly- will see if possible to wean  -would benefit from neuropsych referral to help deal with situational anxiety             -antipsychotic agents: N/A  5. Neuropsych: This patient is capable of making decisions on her own behalf. 6. Skin/Wound Care: Routine skin checks 7. Fluids/Electrolytes/Nutrition: Routine in and outs with follow-up chemistries 8.  Hypothyroidism.  Continue Synthroid 25 mcg Monday Wednesday Friday 9.  Hypertension.  Lasix 40 mg daily, metoprolol 50 mg twice daily Vitals:   12/11/19 0514 12/11/19 0516  BP:  136/89  Pulse: 86 88  Resp: 18   Temp: 97.7 F (36.5 C)   SpO2: 92% 91%  controlled 8/24 10.  Hyperlipidemia.  Lipitor 40 mg daily 11.  History of gastric bypass 2017.  Dietary follow-up 12.  History left TKA April 2021.  Weightbearing as tolerated- left knee actually hurts more than RIght  And needs TKR on R knee, per pt. 13. Grounds pass if portable O2 is available   14.  Post infectious cough on Dulera, on Prednisone taper  LOS: 7 days A FACE TO FACE EVALUATION WAS PERFORMED  May 2021 12/11/2019, 7:28 AM

## 2019-12-11 NOTE — Plan of Care (Signed)
  Problem: Consults Goal: RH GENERAL PATIENT EDUCATION Description: See Patient Education module for education specifics. Outcome: Progressing   Problem: RH BOWEL ELIMINATION Goal: RH STG MANAGE BOWEL WITH ASSISTANCE Description: STG Manage Bowel with sup Assistance. Outcome: Progressing   Problem: RH BLADDER ELIMINATION Goal: RH STG MANAGE BLADDER WITH ASSISTANCE Description: STG Manage Bladder With Supv Assistance Outcome: Progressing   Problem: RH SKIN INTEGRITY Goal: RH STG SKIN FREE OF INFECTION/BREAKDOWN Description: Skin will be free of infection/breakdown with min assist Outcome: Progressing Goal: RH STG MAINTAIN SKIN INTEGRITY WITH ASSISTANCE Description: STG Maintain Skin Integrity With min Assistance. Outcome: Progressing   Problem: RH SAFETY Goal: RH STG ADHERE TO SAFETY PRECAUTIONS W/ASSISTANCE/DEVICE Description: STG Adhere to Safety Precautions With MIN Assistance/Device. Outcome: Progressing   Problem: RH PAIN MANAGEMENT Goal: RH STG PAIN MANAGED AT OR BELOW PT'S PAIN GOAL Description: Pain will be managed at 3 out of 10 at MOD I  Outcome: Progressing   Problem: RH KNOWLEDGE DEFICIT GENERAL Goal: RH STG INCREASE KNOWLEDGE OF SELF CARE AFTER HOSPITALIZATION Description: Pt will be able to verbalize self care related to handouts, therapy, and personalization for independent care with min assist  Outcome: Progressing   

## 2019-12-11 NOTE — Progress Notes (Signed)
Occupational Therapy Session Note  Patient Details  Name: Bonnie Ballard MRN: [id number] Date of Birth: 09-02-1965  Today's Date: 12/11/2019 OT Individual Time: 1100-1200 OT Individual Time Calculation (min): 60 min    Short Term Goals: Week 1:  OT Short Term Goal 1 (Week 1): Patient will complete sit to stand with use of Stedy and Mod A. OT Short Term Goal 2 (Week 1): Patient will complete UB dressing seated EOB with supervision A. OT Short Term Goal 3 (Week 1): Patient will complete LB bathing at bed level with Mod A and use of AE PRN. OT Short Term Goal 4 (Week 1): Patient will complete transfer to [facility] with Mod A and LRAD.  Skilled Therapeutic Interventions/Progress Updates:  Patient met seated in wc in agreement with OT treatment session with focus on functional transfers, energy conservation, and IADLs from wc level as detailed below. Patient self-propelled wc room > dayroom > therapy gym throughout tx. session. Patient demonstrates increased activity tolerance and self-awareness of need for rest breaks. O2 maintained >90% for majority of session with exception of desaturation to 84% after attempt for STS at parallel bars. O2 returned >90% after 25 sec. Blocked practice with lateral scoots R<>L on mat table with frequent rest breaks 2/2 fatigue and DOE. In patient laundry room, patient able to load clothes into washing machine and push "start" with use of reacher. Session concluded with patient seated in wc with call bell within reach and all needs met.    Therapy Documentation Precautions:  Precautions Precautions: Fall, Other (comment) Precaution Comments: 5-7L via [place] Restrictions Weight Bearing Restrictions: No General:    Therapy/Group: Individual Therapy  Eyoel Throgmorton R [place]-Davis 12/11/2019, 7:32 AM

## 2019-12-12 ENCOUNTER — Inpatient Hospital Stay (HOSPITAL_COMMUNITY): Payer: Medicare Other | Admitting: Physical Therapy

## 2019-12-12 ENCOUNTER — Inpatient Hospital Stay (HOSPITAL_COMMUNITY): Payer: Medicare Other

## 2019-12-12 MED ORDER — ZINC OXIDE 40 % EX OINT
TOPICAL_OINTMENT | Freq: Two times a day (BID) | CUTANEOUS | Status: DC
  Filled 2019-12-12 (×2): qty 57

## 2019-12-12 NOTE — Progress Notes (Signed)
Physical Therapy Session Note  Patient Details  Name: Bonnie Ballard MRN: [id number] Date of Birth: February 08, 1966  Today's Date: 12/12/2019 PT Individual Time: 1305-1420 PT Individual Time Calculation (min): 75 min   Short Term Goals: Week 1:  PT Short Term Goal 1 (Week 1): Pt will perform bed mobility with min assist consistently PT Short Term Goal 2 (Week 1): Pt will initiate gait training in parallel bars PT Short Term Goal 3 (Week 1): Pt will propell WC 138f with supervision assist PT Short Term Goal 4 (Week 1): [facility]  Skilled Therapeutic Interventions/Progress Updates: Pt presented in w/c agreeable to therapy. Pt c/o pain in R knee and requesting pain meds, nsg notified and pain meds received during session. Pt transported to ortho gym for energy conservation and participated in use of standing frame for wt bearing through BLE and standing tolerance. Pt performed x 3 trials in standing frame with longest bout of 2 min (first bout). Second bouts approx 30-45 sec however pt was able to perform x 5 TKE in RLE/LLE respectively. Pt then transported to day room and participated in Cybex Kinetron 40cm/sec for two bouts of 1 min each. Pt with mild dyspnea however >90%. Pt required extended rest breaks between bouts due to fatigue. Pt propelled back to room and performed SB transfer back to bed with SBA asssit, pt was then able to perform x 3 lateral scoots to HOB. Pt was able to return to bed with CGA and increased time and repositioned to comfort. Pt left in bed at end of session with bed alarm on, call bell within reach and needs met.       Therapy Documentation Precautions:  Precautions Precautions: Fall, Other (comment) Precaution Comments: 5-7L via [place] Restrictions Weight Bearing Restrictions: No General:   Vital Signs: Therapy Vitals Temp: 97.6 F (36.4 C) Pulse Rate: 81 Resp: 18 BP: 129/79 Patient Position (if appropriate):  Sitting Oxygen Therapy SpO2: 93 % O2 Device: Nasal Cannula O2 Flow Rate (L/min): 5 L/min Pain:   Mobility:   Locomotion :    Trunk/Postural Assessment :    Balance:   Exercises:   Other Treatments:      Therapy/Group: Individual Therapy  Kele Withem 12/12/2019, 7:33 AM

## 2019-12-12 NOTE — Patient Care Conference (Signed)
Inpatient RehabilitationTeam Conference and Plan of Care Update Date: 12/12/2019   Time: 10:09 AM   Patient Name: Bonnie Ballard      Medical Record Number: [id number]  Date of Birth: 04/26/1965 Sex: Female         Room/Bed: 4W16C/4W16C-01 Payor Info: Payor: MEDICARE / Plan: MEDICARE PART A AND B / Product Type: *No Product type* /    Admit Date/Time:  12/04/2019  3:20 PM  Primary Diagnosis:  Intensive care (ICU) myopathy  Hospital Problems: Principal Problem:   Intensive care (ICU) myopathy Active Problems:   Pneumonia due to COVID-19 virus   Critical illness myopathy    Expected Discharge Date: Expected Discharge Date: 12/28/19  Team Members Present: Physician leading conference: Dr. Claudette Laws Care Coodinator Present: Chana Bode, RN, BSN, CRRN;Christina [place], BSW Nurse Present: Regino Schultze, RN PT Present: Grier Rocher, PT OT Present: Lina Sayre, OT SLP Present: Suzzette Righter, CF-SLP PPS Coordinator present : Fae Pippin, SLP     Current Status/Progress Goal Weekly Team Focus  Bowel/Bladder   pt can be inc of bladder, she states she urinates when she has  coughing fit. lbm 12/11/19  maintain contience and control with coughing bouts  Assess q shift and prn, be sure to change wet clothes asap   Swallow/Nutrition/ Hydration             ADL's   Setup UB self care in bed, Max A LB self care bedlevel and EOB using leans, Mod A slideboard transfers  Min A overall  Activity tolerance, functional transfers, ADL retraining, energy conservation/work simplification, general strengthening   Mobility   CGA to minA bed mobility, SB transfer requires total A for set up SBA for transfer, maxA STS in parallel bars, w/c propulsion up to 231ft supervision, low endurance threshold  supervision bed mobility, minA transfers and minA short distance ambulation  transfers, BLE strengthening, endurance   Communication             Safety/Cognition/ Behavioral  Observations            Pain   pt has no current c/o pain  remain pai free  Assess pain q shift an dprn   Skin   Pt has som MASD to bottom, some areas bleed as well as are open, pt complains of stinging in that area with pericare  refrain from furthur skin breakdown a nd infection, heal current MASD  Assess skin q shift and prn, utilize barrier cream and powders for moist areas and incontinenc, help pt turn with wedging     Discharge Planning:  Goal to discharge home with DTR's to asissit with care. Pt lives on 1st floor   Team Discussion: Hip extensor issues. Likely to discharge home with O2; continues to desat with activity. MD ordered prednisone taper over a month. MASD to rectal area. Patient on target to meet rehab goals: yes, continue to note fatigue   *See Care Plan and progress notes for long and short-term goals.   Revisions to Treatment Plan:  Working on transfers with and without the slideboard for discharge Teaching Needs: Transfers, toileting, ADL assistance, medications, etc  Current Barriers to Discharge: Home enviroment access/layout and New oxygen  Possible Resolutions to Barriers: 2 step entry to home with left rail; practicing transfers with therapy     Medical Summary Current Status: still requiring cont O2, pt with weak gluts, cont BM  Barriers to Discharge: Medical stability;New oxygen   Possible Resolutions to [employer]: anticipate need for  home O2, cont prednisone wean   Continued Need for Acute Rehabilitation Level of Care: The patient requires daily medical management by a physician with specialized training in physical medicine and rehabilitation for the following reasons: Direction of a multidisciplinary physical rehabilitation program to maximize functional independence : Yes Medical management of patient stability for increased activity during participation in an intensive rehabilitation regime.: Yes Analysis of laboratory values  and/or radiology reports with any subsequent need for medication adjustment and/or medical intervention. : Yes   I attest that I was present, lead the team conference, and concur with the assessment and plan of the team.   Chana Bode B 12/12/2019, 2:46 PM

## 2019-12-12 NOTE — Progress Notes (Signed)
Physical Therapy Weekly Progress Note  Patient Details  Name: Bonnie Ballard MRN: [id number] Date of Birth: 05/23/1965  Beginning of progress report period: December 05, 2019 End of progress report period: December 12, 2019  Today's Date: 12/12/2019   Patient has met 3 of 4 short term goals.  Pt has made good progress during current week of therapy. Pt was demonstrated significant improvements in bed mobility requiring CGA with use of bed features and can now consistently perform SB transfers with assist for set up and supervision for transfer itself. Pt is slowly progressing with endurance and is able to propel distances >156f and maintain SpO2>90%. Pt currently continues to require maxA for STS due to BLE (moreso hip extensor weakness)  Patient continues to demonstrate the following deficits muscle weakness and decreased cardiorespiratoy endurance and decreased oxygen support and therefore will continue to benefit from skilled PT intervention to increase functional independence with mobility.  Patient progressing toward long term goals..  Continue plan of care.  PT Short Term Goals Week 1:  PT Short Term Goal 1 (Week 1): Pt will perform bed mobility with min assist consistently PT Short Term Goal 1 - Progress (Week 1): Met PT Short Term Goal 2 (Week 1): Pt will initiate gait training in parallel bars PT Short Term Goal 2 - Progress (Week 1): Progressing toward goal PT Short Term Goal 3 (Week 1): Pt will propell WC 1526fwith supervision assist PT Short Term Goal 3 - Progress (Week 1): Met PT Short Term Goal 4 (Week 1): [facility] and mod assist PT Short Term Goal 4 - Progress (Week 1): Met Week 2:  PT Short Term Goal 1 (Week 2): [facility] with SB PT Short Term Goal 2 (Week 2): Pt will perform sit<>stand with mod assist using RW PT Short Term Goal 3 (Week 2): Pt will ambulate 1016fith mod assist in parallel bars        Therapy Documentation Precautions:  Precautions Precautions: Fall, Other (comment) Precaution Comments: 5-7L via [place] Restrictions Weight Bearing Restrictions: No    Vital Signs: Therapy Vitals Temp: 97.7 F (36.5 C) Pulse Rate: 84 Resp: 17 BP: 104/77 Patient Position (if appropriate): Sitting Oxygen Therapy SpO2: 99 % O2 Device: Nasal Cannula Pain: Pain Assessment Pain Scale: 0-10 Pain Score: 5  Pain Type: Acute pain Pain Location: Knee Pain Orientation: Right;Left Pain Descriptors / Indicators: Aching Pain Frequency: Constant Pain Onset: With Activity Pain Intervention(s): Medication (See eMAR)   Therapy/Group: Individual Therapy  Rosita DeChalus 12/12/2019, 1:42 PM

## 2019-12-12 NOTE — Progress Notes (Signed)
Physical Therapy Session Note  Patient Details  Name: Bonnie Ballard MRN: [id number] Date of Birth: 1966-02-07  Today's Date: 12/12/2019 PT Individual Time: 0903-1000 PT Individual Time Calculation (min): 57 min   Short Term Goals: Week 1:  PT Short Term Goal 1 (Week 1): Pt will perform bed mobility with min assist consistently PT Short Term Goal 2 (Week 1): Pt will initiate gait training in parallel bars PT Short Term Goal 3 (Week 1): Pt will propell WC 178f with supervision assist PT Short Term Goal 4 (Week 1): pt [facility] and [facility]   Skilled Therapeutic Interventions/Progress Updates:   Pt received sitting in WC and agreeable to PT. Pt remained on 5-6 L.min supplemental O2 throughout session.   Pt reports need for to have BM. [facility]vision [facility] from [facility]. Pt able to doff pants with only min assist from PT. Pt voided on BSC and performed peri-hygiene with supervision assist from PT with cues for safety and improved lateral lean. PT assisted pt to don pants with max assist and cues for proper weight shift to pull.    739m + 14m32min standing frame with no desat in SpO2. When pt returned to chair mild desat to 92% on each bout, but rebounded to 100% in 30-445sec. Prolonged rest break following each bout.    Patient returned to room and [facility] and all needs met.         Therapy Documentation Precautions:  Precautions Precautions: Fall, Other (comment) Precaution Comments: 5-7L via [place] Restrictions Weight Bearing Restrictions: No Vital Signs: Therapy Vitals Pulse Rate: (!) 112 Resp: 19 Patient Position (if appropriate): Sitting Oxygen Therapy SpO2: 93 % O2 Device: Nasal Cannula O2 Flow Rate (L/min): 6 L/min Pain: Pain Assessment Pain Scale: 0-10 Pain Score: 0-No pain Pain Type: Acute pain Pain Location: Back Pain Orientation: Lower Pain Descriptors / Indicators:  Aching Pain Frequency: Constant Pain Onset: On-going Pain Intervention(s): Medication (See eMAR)    Therapy/Group: Individual Therapy  AusLorie Phenix25/2021, 10:16 AM

## 2019-12-12 NOTE — Progress Notes (Signed)
Occupational Therapy Session Note  Patient Details  Name: Bonnie Ballard MRN: [id number] Date of Birth: 1965/11/07  Today's Date: 12/12/2019 OT Individual Time: 9379-0240 OT Individual Time Calculation (min): 84 min    Short Term Goals: Week 1:  OT Short Term Goal 1 (Week 1): Patient will complete sit to stand with use of [place] and Mod A. OT Short Term Goal 2 (Week 1): Patient will complete UB dressing seated EOB with supervision A. OT Short Term Goal 3 (Week 1): Patient will complete LB bathing at bed level with Mod A and use of AE PRN. OT Short Term Goal 4 (Week 1): Patient will complete transfer to [facility] with Mod A and LRAD.  Skilled Therapeutic Interventions/Progress Updates:    Pt received on bedpan with NT present assisting. Pt finished attempting to void while OT grabbed ADL supplies. Pt with visible SOB and requiring cueing for deep breathing, but overall self managing breathing strategies. Pt reporting she has not yet had her anxiety medicine and RN Deidre Ala was notified of this and shortly thereafter entered room to administer medications. Pt completed rolling R with min A for thoroughness for bedpan to be removed. Significant urine spillage from bedpan into bed and onto linens. Sacral foam dressing was replaced as well as several pads underneath her to protect skin integrity. Pt completed posterior peri hygiene while sidelying with min A for thoroughness. Pt transitioned to EOB with heavy use of bed features and CGA overall. Pt required several minutes rest break with visible SOB. SpO2 88% on 5L [place], after 1 min of SpO2 remaining in the high 80's, her O2 was bumped up to 6L [place] and pt was able to rebound to 94%. Pt changed shirt with assist for managing nasal canula, but (S) overall. Pt washed UB with assist for washing back only. Pt reported need to attempt BM again and wished to transfer to [facility]. Slideboard was placed and pt completed lateral scoot with min A to BSC. Pt given several minutes to  attempt and void. Pt voided BM and completed peri hygiene with mod A while completing lateral leans. Pt donned pants with max A overall. Pt completed slideboard transfer from the [facility] to the w/c with assist for board placement and stabilization but overall CGA! Pt O2 reduced to 5L and pt was 95%. Shoes donned max A. Pt completed oral care at the sink seated. Significant, lengthy rest breaks required throughout session 2/2 fatigue. Pt was left sitting up with all needs met.   Therapy Documentation Precautions:  Precautions Precautions: Fall, Other (comment) Precaution Comments: 5-7L via [place] Restrictions Weight Bearing Restrictions: No   Therapy/Group: Individual Therapy  Curtis Sites 12/12/2019, 6:45 AM

## 2019-12-12 NOTE — Progress Notes (Signed)
[place] PHYSICAL MEDICINE & REHABILITATION PROGRESS NOTE   Subjective/Complaints:  No issues overnite , good BM Has been on 5L continuous   ROS: Patient without CP, SOB, N/V/D  Objective:   No results found. No results for input(s): WBC, HGB, HCT, PLT in the last 72 hours. No results for input(s): NA, K, CL, CO2, GLUCOSE, BUN, CREATININE, CALCIUM in the last 72 hours.  Intake/Output Summary (Last 24 hours) at 12/12/2019 0826 Last data filed at 12/11/2019 2118 Gross per 24 hour  Intake 922 ml  Output --  Net 922 ml     Physical Exam: Vital Signs Blood pressure 129/79, pulse 81, temperature 97.6 F (36.4 C), resp. rate 18, SpO2 93 %.   General: No acute distress Mood and affect are appropriate Heart: Regular rate and rhythm no rubs murmurs or extra sounds Lungs: Clear to auscultation, breathing unlabored, no rales or wheezes Abdomen: Positive bowel sounds, soft nontender to palpation, nondistended Extremities: No clubbing, cyanosis, or edema Skin: No evidence of breakdown, no evidence of rash  Neurologic motor strength is 4/5 in bilateral deltoid, bicep, tricep, grip, hip flexor, knee extensors, ankle dorsiflexor and plantar flexor, decreased LT in distal feet/toes Musculoskeletal: no pain with UE or LE active ROM  No joint swelling    Assessment/Plan: 1. Functional deficits secondary to CIM which require 3+ hours per day of interdisciplinary therapy in a comprehensive inpatient rehab setting.  Physiatrist is providing close team supervision and 24 hour management of active medical problems listed below.  Physiatrist and rehab team continue to assess barriers to discharge/monitor patient progress toward functional and medical goals  Care Tool:  Bathing    Body parts bathed by patient: Front perineal area, Buttocks, Right upper leg, Right lower leg, Left upper leg, Left lower leg, Face   Body parts bathed by helper: Front perineal area, Buttocks, Right upper leg,  Left upper leg, Right lower leg, Left lower leg Body parts n/a: Right arm, Left arm, Chest, Abdomen   Bathing assist Assist Level: Maximal Assistance - Patient 24 - 49%     Upper Body Dressing/Undressing Upper body dressing   What is the patient wearing?: Pull over shirt    Upper body assist Assist Level: Supervision/Verbal cueing    Lower Body Dressing/Undressing Lower body dressing      What is the patient wearing?: Pants, Underwear/pull up     Lower body assist Assist for lower body dressing: Maximal Assistance - Patient 25 - 49%     Toileting Toileting    Toileting assist Assist for toileting: Minimal Assistance - Patient > 75%     Transfers Chair/bed transfer  Transfers assist     Chair/bed transfer assist level: 2 Helpers Occupational hygienist)     Locomotion Ambulation   Ambulation assist   Ambulation activity did not occur: Safety/medical concerns          Walk 10 feet activity   Assist  Walk 10 feet activity did not occur: Safety/medical concerns        Walk 50 feet activity   Assist Walk 50 feet with 2 turns activity did not occur: Safety/medical concerns         Walk 150 feet activity   Assist Walk 150 feet activity did not occur: Safety/medical concerns         Walk 10 feet on uneven surface  activity   Assist Walk 10 feet on uneven surfaces activity did not occur: Safety/medical concerns         Wheelchair  Assist Will patient use wheelchair at discharge?: Yes Type of Wheelchair: Manual    Wheelchair assist level: Supervision/Verbal cueing Max wheelchair distance: 100    Wheelchair 50 feet with 2 turns activity    Assist        Assist Level: Supervision/Verbal cueing   Wheelchair 150 feet activity     Assist      Assist Level: Supervision/Verbal cueing (mult brief rest breaks)   Blood pressure 129/79, pulse 81, temperature 97.6 F (36.4 C), resp. rate 18, SpO2 93 %.  Medical Problem List and  Plan: 1.  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  Continue prednisone with taper. Doses adjusted by pharmacy yesterday. Currently 30mg  bid  -Currently with 5 L oxygen nasal cannula             -patient may  shower             -ELOS/Goals:  Mod I to supervision 2-3 weeks CIR PT, OT, pt motivated  2.  Antithrombotics: -DVT/anticoagulation: Bilateral lower extremity DVTs.  Patient will remain on Eliquis 5 mg twice daily for a minimum of 3 months total             -antiplatelet therapy: N/A 3. Pain Management: Hydrocodone 1 tablet every 6 hours as needed- pt said she took Percocet 10/325 mg QID at home- Added heat , Kpad 4. Mood: Clonazepam 0.5 mg 3 times daily, Paxil 40 mg daily, melatonin 6 mg nightly- will see if possible to wean  -would benefit from neuropsych referral to help deal with situational anxiety             -antipsychotic agents: N/A  5. Neuropsych: This patient is capable of making decisions on her own behalf. 6. Skin/Wound Care: Routine skin checks 7. Fluids/Electrolytes/Nutrition: Routine in and outs with follow-up chemistries 8.  Hypothyroidism.  Continue Synthroid 25 mcg Monday Wednesday Friday 9.  Hypertension.  Lasix 40 mg daily, metoprolol 50 mg twice daily Vitals:   12/11/19 2038 12/12/19 0428  BP:  129/79  Pulse:  81  Resp:  18  Temp:  97.6 F (36.4 C)  SpO2: 94% 93%  controlled 8/25 10.  Hyperlipidemia.  Lipitor 40 mg daily 11.  History of gastric bypass 2017.  Dietary follow-up 12.  History left TKA April 2021.  Weightbearing as tolerated- left knee actually hurts more than RIght  And needs TKR on R knee, per pt. 13. Grounds pass if portable O2 is available   14.  Post infectious cough on Dulera, on Prednisone taper  LOS: 8 days A FACE TO FACE EVALUATION WAS PERFORMED  May 2021 12/12/2019, 8:26 AM

## 2019-12-12 NOTE — Progress Notes (Signed)
Patient ID: Bonnie Ballard, female   DOB: February 11, 1966, 54 y.o.   MRN: [id number]   Team Conference Report to Patient/Family  Team Conference discussion was reviewed with the patient and caregiver, including goals, any changes in plan of care and target discharge date.  Patient and caregiver express understanding and are in agreement.  The patient has a target discharge date of 12/28/19.  [name] 12/12/2019, 2:22 PM

## 2019-12-13 ENCOUNTER — Inpatient Hospital Stay (HOSPITAL_COMMUNITY): Payer: Medicare Other | Admitting: *Deleted

## 2019-12-13 ENCOUNTER — Inpatient Hospital Stay (HOSPITAL_COMMUNITY): Payer: Medicare Other | Admitting: Occupational Therapy

## 2019-12-13 ENCOUNTER — Encounter (HOSPITAL_COMMUNITY): Payer: Self-pay | Admitting: Physical Medicine & Rehabilitation

## 2019-12-13 ENCOUNTER — Inpatient Hospital Stay (HOSPITAL_COMMUNITY): Payer: Medicare Other | Admitting: Physical Therapy

## 2019-12-13 NOTE — Evaluation (Addendum)
Recreational Therapy Assessment and Plan  Patient Details  Name: Bonnie Ballard MRN: [id number] Date of Birth: December 03, 1965 Today's Date: 12/13/2019  Rehab Potential:  good ELOS: d/c 9/10  Assessment  Hospital Problem: Principal Problem:   Intensive care (ICU) myopathy Active Problems:   Pneumonia due to COVID-19 virus   Critical illness myopathy   Past Medical History: No past medical history on file.  Assessment & Plan Clinical Impression: Patient is a 54 -year-old right-handed female history of gastric bypass 2017, hypertension, sarcoidosis, hyperlipidemia, hypothyroidism, left partial TKA April 2021. Presented to outside hospital 09/23/2019([facility])with COVID-19 pneumonia,developed ARDS and requiredBiPAP. Ptnarrowly avoidedintubation and was maintained on Solu-Medrol. She was weaned down to high flow oxygen. Hospital course complicated by bilateral DVTs and chest CT negative for pulmonary emboli. She was placed on Eliquis for DVT. Hospital course bouts of insomnia maintained on melatonin as well as anxiety with Klonopin 3 times daily.[facility] for ongoing O2 wean and is currently maintained on 5L via nasal cannula, up to 7L via nasal cannula with activity.  Patient transferred to CIR on 12/04/2019.   Pt presents with decreased activity tolerance, decreased functional mobility, decreased balance & Decreased oxygen support Limiting pt's independence with leisure/community pursuits.   Met with pt today to discuss TR services, activity analysis with identification of potential modifications and coping strategies.  Pt stated understanding and appreciation for this session.  Plan Min 1 TR >20 minutes per week during LOS  Recommendations for other services: None    Discharge Criteria: Patient will be discharged from TR if patient refuses treatment 3 consecutive times without medical reason.  If treatment goals not met,  if there is a change in medical status, if patient makes no progress towards goals or if patient is discharged from hospital.  The above assessment, treatment plan, treatment alternatives and goals were discussed and mutually agreed upon: by patient  Royal Kunia 12/13/2019, 3:24 PM

## 2019-12-13 NOTE — Progress Notes (Signed)
Physical Therapy Session Note  Patient Details  Name: Bonnie Ballard MRN: [id number] Date of Birth: 1965-06-27  Today's Date: 12/13/2019 PT Individual Time: 1335-1413 and 1105-1200 PT Individual Time Calculation (min): 38 min and 55 min    Short Term Goals: Week 1:  PT Short Term Goal 1 (Week 1): Pt will perform bed mobility with min assist consistently PT Short Term Goal 1 - Progress (Week 1): Met PT Short Term Goal 2 (Week 1): Pt will initiate gait training in parallel bars PT Short Term Goal 2 - Progress (Week 1): Progressing toward goal PT Short Term Goal 3 (Week 1): Pt will propell WC 16f with supervision assist PT Short Term Goal 3 - Progress (Week 1): Met PT Short Term Goal 4 (Week 1): [facility] PT Short Term Goal 4 - Progress (Week 1): Met Week 2:  PT Short Term Goal 1 (Week 2): [facility] PT Short Term Goal 2 (Week 2): Pt will perform sit<>stand with mod assist using RW PT Short Term Goal 3 (Week 2): Pt will ambulate 130fwith mod assist in parallel bars  Skilled Therapeutic Interventions/Progress Updates:  Session 1   Pt received sitting in WC and agreeable to PT. Pt performed WC mobility over level surface x [id number] then preformed sit<>stand in parallel bars x 4 with mod-max assist and BUE support on rails. In 2nd and 4th bouts of standing pt performed stepping task x 2 BLE on each bout. PT required to block LE in stance to prevent buckling. Pt required prolonged therapeutic rest break following each bout due to SOB, and fatigu; unable to assess SpO2 on this day.   Pt then transported to [place]rrance of [place] mobility x [id number] unlevel cement sidewalk with supervision assist and and cues for improved control and anterior weight shift on unlevel surface to prevent veer to the R or L.   Seated LE therex: LAQ, hip/knee extension against manual resistance from PT. Each completed 2 x  12 BLE with cues for full ROM and proper speed to maximize strengthening aspect of movements.    Patient returned to room and [facility] reach and all needs met.        Session 2.   Pt received sitting in WC and agreeable to PT. Pt transported to day room in WC. [facility] to perform reciprocal marches [facility] with therapeutic rest break between bouts. WC mobility through [place] with supervision assist 2x [id number] 1 shourt rest break due to fatigue. Pt returned to room and performed SB transfer to bed with supervision assist from PT following set up of WC [facility] [facility] board. Sit>supine completed with min assist a tthe LLE due to LE weakness, and left supine in bed with call bell in reach and all needs met.     Therapy Documentation Precautions:  Precautions Precautions: Fall, Other (comment) Precaution Comments: 5-7L via [place] Restrictions Weight Bearing Restrictions: No Vital Signs: Therapy Vitals Temp: 97.8 F (36.6 C) Pulse Rate: 99 Resp: 17 BP: 110/72 Patient Position (if appropriate): Sitting Oxygen Therapy SpO2: 97 % O2 Device: Nasal Cannula (incorrect documentation. ) O2 Flow Rate (L/min): 5 L/min Pain: Pain Assessment Pain Scale: 0-10 Pain Score: 7  Pain Type: Acute pain Pain Location: Leg Pain Orientation: Right;Left Pain Descriptors / Indicators: Aching Pain Frequency: Constant  Pain Onset: With Activity Pain Intervention(s): Medication (See eMAR)   Therapy/Group: Individual Therapy  Lorie Phenix 12/13/2019, 2:17 PM

## 2019-12-13 NOTE — Progress Notes (Signed)
[facility] PHYSICAL MEDICINE & REHABILITATION PROGRESS NOTE   Subjective/Complaints:  No new issues , discussed d/c date , "butt and calf muscles sore "  ROS: Patient without CP, SOB, N/V/D  Objective:   No results found. No results for input(s): WBC, HGB, HCT, PLT in the last 72 hours. No results for input(s): NA, K, CL, CO2, GLUCOSE, BUN, CREATININE, CALCIUM in the last 72 hours.  Intake/Output Summary (Last 24 hours) at 12/13/2019 0807 Last data filed at 12/12/2019 2152 Gross per 24 hour  Intake 840 ml  Output --  Net 840 ml     Physical Exam: Vital Signs Blood pressure 111/87, pulse 80, temperature 98.1 F (36.7 C), resp. rate 17, SpO2 94 %.  General: No acute distress Mood and affect are appropriate Heart: Regular rate and rhythm no rubs murmurs or extra sounds Lungs: Clear to auscultation, breathing unlabored, no rales or wheezes Abdomen: Positive bowel sounds, soft nontender to palpation, nondistended Extremities: No clubbing, cyanosis, or edema Skin: No evidence of breakdown, no evidence of rash  Neurologic motor strength is 4/5 in bilateral deltoid, bicep, tricep, grip, hip flexor, knee extensors, ankle dorsiflexor and plantar flexor, decreased LT in distal feet/toes Musculoskeletal: no pain with UE or LE active ROM  No joint swelling    Assessment/Plan: 1. Functional deficits secondary to CIM which require 3+ hours per day of interdisciplinary therapy in a comprehensive inpatient rehab setting.  Physiatrist is providing close team supervision and 24 hour management of active medical problems listed below.  Physiatrist and rehab team continue to assess barriers to discharge/monitor patient progress toward functional and medical goals  Care Tool:  Bathing    Body parts bathed by patient: Front perineal area, Face, Right arm, Left arm, Chest, Abdomen, Buttocks   Body parts bathed by helper: Front perineal area, Buttocks, Right upper leg, Left upper leg,  Right lower leg, Left lower leg Body parts n/a: Right arm, Left arm, Chest, Abdomen   Bathing assist Assist Level: Moderate Assistance - Patient 50 - 74%     Upper Body Dressing/Undressing Upper body dressing   What is the patient wearing?: Pull over shirt    Upper body assist Assist Level: Supervision/Verbal cueing    Lower Body Dressing/Undressing Lower body dressing      What is the patient wearing?: Pants     Lower body assist Assist for lower body dressing: Maximal Assistance - Patient 25 - 49%     Toileting Toileting    Toileting assist Assist for toileting: Moderate Assistance - Patient 50 - 74%     Transfers Chair/bed transfer  Transfers assist     Chair/bed transfer assist level: Contact Guard/Touching assist (slideboard)     Locomotion Ambulation   Ambulation assist   Ambulation activity did not occur: Safety/medical concerns          Walk 10 feet activity   Assist  Walk 10 feet activity did not occur: Safety/medical concerns        Walk 50 feet activity   Assist Walk 50 feet with 2 turns activity did not occur: Safety/medical concerns         Walk 150 feet activity   Assist Walk 150 feet activity did not occur: Safety/medical concerns         Walk 10 feet on uneven surface  activity   Assist Walk 10 feet on uneven surfaces activity did not occur: Safety/medical concerns         Wheelchair     Assist  Will patient use wheelchair at discharge?: Yes Type of Wheelchair: Manual    Wheelchair assist level: Supervision/Verbal cueing Max wheelchair distance: 100    Wheelchair 50 feet with 2 turns activity    Assist        Assist Level: Supervision/Verbal cueing   Wheelchair 150 feet activity     Assist      Assist Level: Supervision/Verbal cueing (mult brief rest breaks)   Blood pressure 111/87, pulse 80, temperature 98.1 F (36.7 C), resp. rate 17, SpO2 94 %.  Medical Problem List and  Plan: 1.  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  Continue prednisone with taper. Doses adjusted by pharmacy yesterday. Currently 30mg  bid  -Currently with 5 L oxygen nasal cannula             -patient may  shower             -ELOS/Goals:  Mod I to supervision 9/10 CIR PT, OT, 2.  Antithrombotics: -DVT/anticoagulation: Bilateral lower extremity DVTs.  Patient will remain on Eliquis 5 mg twice daily for a minimum of 3 months total             -antiplatelet therapy: N/A 3. Pain Management: Hydrocodone 1 tablet every 6 hours as needed- pt said she took Percocet 10/325 mg QID at home- Added heat , Kpad 4. Mood: Clonazepam 0.5 mg 3 times daily, Paxil 40 mg daily, melatonin 6 mg nightly- will see if possible to wean  -would benefit from neuropsych referral to help deal with situational anxiety             -antipsychotic agents: N/A  5. Neuropsych: This patient is capable of making decisions on her own behalf. 6. Skin/Wound Care: Routine skin checks 7. Fluids/Electrolytes/Nutrition: Routine in and outs with follow-up chemistries 8.  Hypothyroidism.  Continue Synthroid 25 mcg Monday Wednesday Friday 9.  Hypertension.  Lasix 40 mg daily, metoprolol 50 mg twice daily Vitals:   12/12/19 2057 12/13/19 0605  BP: 110/73 111/87  Pulse: (!) 102 80  Resp: 20 17  Temp: 98.1 F (36.7 C)   SpO2: 94% 94%  controlled 8/26 10.  Hyperlipidemia.  Lipitor 40 mg daily 11.  History of gastric bypass 2017.  Dietary follow-up 12.  History left TKA April 2021.  Weightbearing as tolerated- left knee actually hurts more than RIght  And needs TKR on R knee, per pt. 13. Grounds pass if portable O2 is available   14.  Post infectious cough on Dulera, on Prednisone taper  LOS: 9 days A FACE TO FACE EVALUATION WAS PERFORMED  May 2021 12/13/2019, 8:07 AM

## 2019-12-13 NOTE — Progress Notes (Addendum)
Occupational Therapy Weekly Progress Note  Patient Details  Name: Bonnie Ballard MRN: [id number] Date of Birth: 1965-07-25  Beginning of progress report period: December 05, 2019 End of progress report period: December 13, 2019  Today's Date: 12/13/2019 OT Individual Time: 0849-1000 OT Individual Time Calculation (min): 71 min    Patient has met 3 of 4 short term goals. On admission, patient was completing UB/LB bathing/dressing in supine 2/2 fatigue, O2 desaturation, and decreased activity tolerance. Patient now able to complete UB bathing/dressing seated EOB with supervision overall. Patient also able to complete LB bathing seated EOB or on wide drop-arm BSC with supervision A. Patient able to don BLE through LB clothing with figure-4 position but requires Mod-Max A to hike pants over hips in sitting with utilization of lateral leans. Patient continues to require frequent and extended rest breaks 2/2 fatigue and occasional O2 desaturation but is able to return >90% within 20-30 seconds at best. Patient completes grooming tasks seated at sink level with supervision A. Patient has progress from requiring Max A for slideboard transfers to requiring supervision A after set-up of board.   Patient continues to demonstrate the following deficits: muscle weakness and therefore will continue to benefit from skilled OT intervention to enhance overall performance with BADL.  Patient progressing toward long term goals..  Continue plan of care.  OT Short Term Goals Week 1:  OT Short Term Goal 1 (Week 1): Patient will complete sit to stand with use of Stedy and Mod A. OT Short Term Goal 1 - Progress (Week 1): Progressing toward goal OT Short Term Goal 2 (Week 1): Patient will complete UB dressing seated EOB with supervision A. OT Short Term Goal 2 - Progress (Week 1): Met OT Short Term Goal 3 (Week 1): Patient will complete LB bathing at bed level with Mod A and use of AE PRN. OT Short Term Goal 3 - Progress  (Week 1): Met OT Short Term Goal 4 (Week 1): Patient will complete transfer to [facility] with Mod A and LRAD. OT Short Term Goal 4 - Progress (Week 1): Met  Week 2:  OT Short Term Goal 1 (Week 2): Patient will complete sit to stand with use of Stedy and Mod A. OT Short Term Goal 2 (Week 2): Patient will complete LB bathing seated EOB with Mod A and lateral leans. OT Short Term Goal 3 (Week 2): Patient will complete 2/3 parts of toileting task seated on BSC.  Skilled Therapeutic Interventions/Progress Updates:  Patient met lying supine in bed in agreement with OT treatment session with focus on self-care re-education, activity tolerance, energy conservation and functional transfers as detailed below. Supine to EOB with supervision A and use of bed features. UB bathing/dressing with set-up assist seated EOB. SB transfer to [facility] with close supervision A. Once seated on BSC, patient completed toileting/hygiene/clothing management with Mod A. LB bathing with supervision A and LB dressing with Mod-Max A to hike pants over hips in sitting. Patient able to don bilateral shoes with set-up assist. SB transfer to wc with supervision A. Patient continues to require extended rest breaks between activities but demonstrates more awareness of need for rest breaks. SpO2 >88-90% for duration of treatment session. Session concluded with patient seated in wc with call bell within reach and all needs met.   Therapy Documentation Precautions:  Precautions Precautions: Fall, Other (comment) Precaution Comments: 5-7L via [place] Restrictions Weight Bearing Restrictions: No General:    Therapy/Group: Individual Therapy  Keylan Costabile R Howerton-Davis 12/13/2019, 7:29 AM

## 2019-12-14 ENCOUNTER — Inpatient Hospital Stay (HOSPITAL_COMMUNITY): Payer: Medicare Other | Admitting: Occupational Therapy

## 2019-12-14 ENCOUNTER — Inpatient Hospital Stay (HOSPITAL_COMMUNITY): Payer: Medicare Other

## 2019-12-14 ENCOUNTER — Inpatient Hospital Stay (HOSPITAL_COMMUNITY): Payer: Medicare Other | Admitting: Physical Therapy

## 2019-12-14 NOTE — Progress Notes (Signed)
[facility] PHYSICAL MEDICINE & REHABILITATION PROGRESS NOTE   Subjective/Complaints: Patient states she worked hard in therapy yesterday slept very well.  No new complaints.  Still on 5 L O2.  We discussed starting to wean O2 next week  ROS: Patient without CP, SOB, N/V/D  Objective:   No results found. No results for input(s): WBC, HGB, HCT, PLT in the last 72 hours. No results for input(s): NA, K, CL, CO2, GLUCOSE, BUN, CREATININE, CALCIUM in the last 72 hours.  Intake/Output Summary (Last 24 hours) at 12/14/2019 1350 Last data filed at 12/14/2019 1330 Gross per 24 hour  Intake 1240 ml  Output --  Net 1240 ml     Physical Exam: Vital Signs Blood pressure 110/81, pulse 93, temperature 97.6 F (36.4 C), temperature source Oral, resp. rate 18, SpO2 92 %.  General: No acute distress Mood and affect are appropriate Heart: Regular rate and rhythm no rubs murmurs or extra sounds Lungs: Clear to auscultation, breathing unlabored, no rales or wheezes Abdomen: Positive bowel sounds, soft nontender to palpation, nondistended Extremities: No clubbing, cyanosis, or edema Skin: No evidence of breakdown, no evidence of rash Neurologic: Cranial nerves II through XII intact, motor strength is 5/5 in bilateral deltoid, bicep, tricep, grip, hip flexor, knee extensors, ankle dorsiflexor and plantar flexor Sensory exam normal sensation to light touch and proprioception in bilateral upper and lower extremities Cerebellar exam normal finger to nose to finger as well as heel to shin in bilateral upper and lower extremities Musculoskeletal: Full range of motion in all 4 extremities. No joint swelling  Neurologic motor strength is 4/5 in bilateral deltoid, bicep, tricep, grip, hip flexor, knee extensors, ankle dorsiflexor and plantar flexor, decreased LT in distal feet/toes Musculoskeletal: no pain with UE or LE active ROM  No joint swelling    Assessment/Plan: 1. Functional deficits secondary to  CIM which require 3+ hours per day of interdisciplinary therapy in a comprehensive inpatient rehab setting.  Physiatrist is providing close team supervision and 24 hour management of active medical problems listed below.  Physiatrist and rehab team continue to assess barriers to discharge/monitor patient progress toward functional and medical goals  Care Tool:  Bathing    Body parts bathed by patient: Front perineal area, Face, Right arm, Left arm, Chest, Abdomen, Buttocks, Right upper leg, Left upper leg, Right lower leg, Left lower leg   Body parts bathed by helper: Front perineal area, Buttocks, Right upper leg, Left upper leg, Right lower leg, Left lower leg Body parts n/a: Right arm, Left arm, Chest, Abdomen   Bathing assist Assist Level: Moderate Assistance - Patient 50 - 74%     Upper Body Dressing/Undressing Upper body dressing   What is the patient wearing?: Pull over shirt    Upper body assist Assist Level: Supervision/Verbal cueing    Lower Body Dressing/Undressing Lower body dressing      What is the patient wearing?: Pants     Lower body assist Assist for lower body dressing: Maximal Assistance - Patient 25 - 49%     Toileting Toileting    Toileting assist Assist for toileting: Moderate Assistance - Patient 50 - 74%     Transfers Chair/bed transfer  Transfers assist     Chair/bed transfer assist level: Contact Guard/Touching assist (Slideboard)     Locomotion Ambulation   Ambulation assist   Ambulation activity did not occur: Safety/medical concerns          Walk 10 feet activity   Assist  Walk  10 feet activity did not occur: Safety/medical concerns        Walk 50 feet activity   Assist Walk 50 feet with 2 turns activity did not occur: Safety/medical concerns         Walk 150 feet activity   Assist Walk 150 feet activity did not occur: Safety/medical concerns         Walk 10 feet on uneven surface   activity   Assist Walk 10 feet on uneven surfaces activity did not occur: Safety/medical concerns         Wheelchair     Assist Will patient use wheelchair at discharge?: Yes Type of Wheelchair: Manual    Wheelchair assist level: Supervision/Verbal cueing Max wheelchair distance: 100    Wheelchair 50 feet with 2 turns activity    Assist        Assist Level: Supervision/Verbal cueing   Wheelchair 150 feet activity     Assist      Assist Level: Supervision/Verbal cueing (mult brief rest breaks)   Blood pressure 110/81, pulse 93, temperature 97.6 F (36.4 C), temperature source Oral, resp. rate 18, SpO2 92 %.  Medical Problem List and Plan: 1.  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  Continue prednisone with taper Currently 20mg  bid  -Currently with 5 L oxygen nasal cannula             -patient may  shower             -ELOS/Goals:  Mod I to supervision 9/10 CIR PT, OT, 2.  Antithrombotics: -DVT/anticoagulation: Bilateral lower extremity DVTs.  Patient will remain on Eliquis 5 mg twice daily for a minimum of 3 months total             -antiplatelet therapy: N/A 3. Pain Management: Hydrocodone 1 tablet every 6 hours as needed- pt said she took Percocet 10/325 mg QID at home- Added heat , Kpad 4. Mood: Clonazepam 0.5 mg 3 times daily, Paxil 40 mg daily, melatonin 6 mg nightly- will see if possible to wean  -would benefit from neuropsych referral to help deal with situational anxiety             -antipsychotic agents: N/A  5. Neuropsych: This patient is capable of making decisions on her own behalf. 6. Skin/Wound Care: Routine skin checks 7. Fluids/Electrolytes/Nutrition: Routine in and outs with follow-up chemistries 8.  Hypothyroidism.  Continue Synthroid 25 mcg Monday Wednesday Friday 9.  Hypertension.  Lasix 40 mg daily, metoprolol 50 mg twice daily Vitals:   12/14/19 0608 12/14/19 1310  BP: 126/81 110/81  Pulse: 91 93   Resp:  18  Temp: 97.9 F (36.6 C) 97.6 F (36.4 C)  SpO2: 94% 92%  controlled 8/27 10.  Hyperlipidemia.  Lipitor 40 mg daily 11.  History of gastric bypass 2017.  Dietary follow-up 12.  History left TKA April 2021.  Weightbearing as tolerated- left knee actually hurts more than RIght  And needs TKR on R knee, per pt. 13. Grounds pass if portable O2 is available   14.  Post infectious cough on Dulera, on Prednisone taper  LOS: 10 days A FACE TO FACE EVALUATION WAS PERFORMED  May 2021 12/14/2019, 1:50 PM

## 2019-12-14 NOTE — Progress Notes (Addendum)
Occupational Therapy Session Note  Patient Details  Name: Bonnie Ballard MRN: [id number] Date of Birth: June 14, 1965  Today's Date: 12/14/2019 OT Individual Time: 7903-8333 OT Individual Time Calculation (min): 57 min   OT Individual Time: 8329-1916 OT Individual Time Calculation (min): 58 min   Short Term Goals: Week 2:  OT Short Term Goal 1 (Week 2): Patient will complete sit to stand with use of Stedy and Mod A. OT Short Term Goal 2 (Week 2): Patient will complete LB bathing seated EOB with Mod A and lateral leans. OT Short Term Goal 3 (Week 2): Patient will complete 2/3 parts of toileting task seated on BSC.  Skilled Therapeutic Interventions/Progress Updates:  Session 1: Patient met lying supine in bed in agreement with OT treatment session. Patient demonstrates increased independence with self-care tasks seated EOB with rest breaks as needed. Patient donned UB clothing with supervision A seated EOB. Patient with increased difficulty catching her breath this morning requiring more frequent and extended rest breaks for recovery between tasks. Patient able to thread bilateral legs through LB clothing seated EOB. Transfer to drop-arm [facility] with Mod A and increased time 2/2 fatigue. Patient able to complete hygiene with assist for clothing management. With lateral leans, patient required assist to hike pants over bilateral hips. SB transfer to wc with supervision A. Patient c/o feeling SOB with SpO2 78% on 5L. O2 titrated up to 6L with patient returning to 91%. Seated at sink level, patient completed 3/3 grooming tasks with set-up assist.   Session 2: Patient met lying supine in bed in agreement with OT treatment session with focus on BLE therapeutic exercise at bed level. Patient education on pelvic floor exercises to strengthen musculature and decrease urinary stress incontinence. In side lying patient completed 2 sets x 15 reps each of clam shells and hip extensions with knee flexed. Patient  transitioned to prone with Mod A and use of chuck pad. In prone, patient completed 2 sets x15 reps each of straight leg raises. Patient able to tolerate lying in prone for 15+ minutes. Mod A with use of chuck pad for rolling from prone to side lying and supervision A with use of grab bars for sidelying to supine. Session concluded with patient in supine call bell within reach, bed alarm activated, and all needs met.   Therapy Documentation Precautions:  Precautions Precautions: Fall, Other (comment) Precaution Comments: 5-7L via [place] Restrictions Weight Bearing Restrictions: No General:     Therapy/Group: Individual Therapy  Mahamadou Weltz R Howerton-Davis 12/14/2019, 7:22 AM

## 2019-12-14 NOTE — Progress Notes (Signed)
Recreational Therapy Session Note  Patient Details  Name: Bonnie Ballard MRN: [id number] Date of Birth: [birth year] Today's Date: 12/14/2019  Pain: no c/o Skilled Therapeutic Interventions/Progress Updates: Session focused on activity tolerance and dynamic sitting balance during co-treat with PT.  Pt up in w/c on arrival but with c/o fatigue from previous therapies.  When questioned about activity preferences to day to work on activity tolerance and balance, pt requested to play the wii.  PT joined session and pt taken to the dayroom via w/c total assist for energy conservation.  Pt performed slide board transfer to mat with set up assistance/supervision.  Once seated on the mat, mat elevated so that pt in perched position to increase WBing through BLEs.  Pt played wii bowling, kayaking, & speed slice with supervision , verbal cues.  Pt continually stated throughout session how much she enjoyed this activity.  Therapy/Group: Co-Treatment  Maymuna Detzel 12/14/2019, 12:25 PM

## 2019-12-14 NOTE — Progress Notes (Signed)
Physical Therapy Session Note  Patient Details  Name: Bonnie Ballard MRN: [id number] Date of Birth: 1965-06-18  Today's Date: 12/14/2019 PT Individual Time: 0905-1005 PT Individual Time Calculation (min): 60 min   Short Term Goals: Week 2:  PT Short Term Goal 1 (Week 2): Pt will transfer to and from [facility] with supervision assist with SB PT Short Term Goal 2 (Week 2): Pt will perform sit<>stand with mod assist using RW PT Short Term Goal 3 (Week 2): Pt will ambulate 68f with mod assist in parallel bars  Skilled Therapeutic Interventions/Progress Updates:  Pt received sitting in WC and agreeable to PT. [facility] Pt reporting feeling extremely fatigue due to therapy on the previous day. Sb transfer to mat table with set up assist from PT.   PT treatment focused on sitting balance while engaged in gross motor UE tasks using Wii resort. Initially maintained in elevated sitting position to force WB through BLE; Wii bowling x 10 frames. Pt reports increased SPB following 7 frames. SpO2 checked at 87% on 4/Lmin. O2 increased to 6L/min and spO2 increased to 97%. Pt then performed  speed slice from standard seat height. And again reports mild SOB SPO2 90%, increased to 95% after 1 min rest break and use of pursed lip breathing. [place] speed run x 2 with multimodal cues for improve technique and to increase use of trunkal musculature to increase ROM.  [facility] with cues for improved use of BLE as able. [facility] returned to [facility] and [facility] and all needs met.         Therapy Documentation Precautions:  Precautions Precautions: Fall, Other (comment) Precaution Comments: 5-7L via [place] Restrictions Weight Bearing Restrictions: No    Pain: Pain Assessment Pain Scale: 0-10 Pain Score: 0-No pain    Therapy/Group: Individual Therapy  ALorie Phenix8/27/2021, 10:48 AM

## 2019-12-14 NOTE — Progress Notes (Signed)
Physical Therapy Session Note  Patient Details  Name: Bonnie Ballard MRN: [id number] Date of Birth: 17-Nov-1965  Today's Date: 12/14/2019 PT Individual Time: 1110-1140 PT Individual Time Calculation (min): 30 min   Short Term Goals: Week 1:  PT Short Term Goal 1 (Week 1): Pt will perform bed mobility with min assist consistently PT Short Term Goal 1 - Progress (Week 1): Met PT Short Term Goal 2 (Week 1): Pt will initiate gait training in parallel bars PT Short Term Goal 2 - Progress (Week 1): Progressing toward goal PT Short Term Goal 3 (Week 1): Pt will propell WC 132f with supervision assist PT Short Term Goal 3 - Progress (Week 1): Met PT Short Term Goal 4 (Week 1): [facility] PT Short Term Goal 4 - Progress (Week 1): Met Week 2:  PT Short Term Goal 1 (Week 2): [facility] Short Term Goal 2 (Week 2): Pt will perform sit<>stand with mod assist using RW PT Short Term Goal 3 (Week 2): Pt will ambulate 155fwith mod assist in parallel bars  Skilled Therapeutic Interventions/Progress Updates:    PAIN denies pain this am  Pt initially oob in wc, states her legs feel very fatigued.  Therapist provided 02 tank, [facility].  wc propulsion x 12034f/bilat UEs, single rest break.  In parallel bars attempted STS w/max assist but unable to achieve upright w/mult attempts.  Retropulsion of wc x 150f23fr LE strengthening.  Kinetron trials: 20*/sec 50sec 90*/sec 1.5 min  20*/sec 65 sec  wc propulsion w/bilat UEs x100ft10f left oob in wc w/alarm belt set and needs in reach      Therapy Documentation Precautions:  Precautions Precautions: Fall, Other (comment) Precaution Comments: 5-7L via [place] Restrictions Weight Bearing Restrictions: No    Therapy/Group: Individual Therapy  BarbaCallie Fielding  [place]/2021, 12:25 PM

## 2019-12-15 ENCOUNTER — Inpatient Hospital Stay (HOSPITAL_COMMUNITY): Payer: Medicare Other | Admitting: Physical Therapy

## 2019-12-15 ENCOUNTER — Encounter (HOSPITAL_COMMUNITY): Payer: Medicare Other | Admitting: Occupational Therapy

## 2019-12-15 MED ORDER — BOOST / RESOURCE BREEZE PO LIQD CUSTOM
1.0000 | Freq: Two times a day (BID) | ORAL | Status: DC
  Administered 2019-12-16 – 2019-12-27 (×16): 1 via ORAL
  Filled 2019-12-15 (×2): qty 1

## 2019-12-15 NOTE — Progress Notes (Signed)
Occupational Therapy Session Note  Patient Details  Name: Bonnie Ballard MRN: [id number] Date of Birth: 03/16/1966  Today's Date: 12/15/2019 OT Group Time: 1100-1200 OT Group Time Calculation (min): 60 min  Skilled Therapeutic Interventions/Progress Updates:    Pt engaged in therapeutic w/c level dance group focusing on patient choice, UE/LE strengthening, salience, activity tolerance, and social participation. Pt was guided through various dance-based exercises involving UEs/LEs and trunk. All music was selected by group members. Emphasis placed on general strengthening and activity tolerance while maintaining stable vitals. 02 sats throughout session 95-99% on 6L. Pt exhibited high levels of participation throughout group, often worked UB/LB simultaneously in beat to music. She requested songs to listen to and socially engaged with others. At end of session pt was returned to room and left with all needs within reach, hooked up to room 02 and satting at 93% on 5L before departure.      Therapy Documentation Precautions:  Precautions Precautions: Fall, Other (comment) Precaution Comments: 5-7L via [place] Restrictions Weight Bearing Restrictions: No Pain: no s/s pain during tx Pain Assessment Pain Score: 3  ADL: ADL Eating: Independent Where Assessed-Eating: Bed level Grooming: Setup Where Assessed-Grooming: Edge of bed Upper Body Bathing: Minimal assistance Where Assessed-Upper Body Bathing: Bed level Lower Body Bathing: Moderate assistance Where Assessed-Lower Body Bathing: Bed level Upper Body Dressing: Minimal assistance Where Assessed-Upper Body Dressing: Bed level Lower Body Dressing: Maximal assistance Where Assessed-Lower Body Dressing: Bed level Toileting: Maximal assistance Where Assessed-Toileting: Bed level Toilet Transfer: Maximal assistance Toilet Transfer Method: Other (comment) Antony Salmon) Toilet Transfer Equipment: Bedside commode Tub/Shower Transfer: Unable to assess      Therapy/Group: Group Therapy  [place] Janus A Vedant Shehadeh 12/15/2019, 12:46 PM

## 2019-12-15 NOTE — Progress Notes (Signed)
[place] PHYSICAL MEDICINE & REHABILITATION PROGRESS NOTE   Subjective/Complaints: Sleeping well Breathing well Tired from therapy Asks for Boost for protein  ROS: Patient denies CP, SOB, N/V/D  Objective:   No results found. No results for input(s): WBC, HGB, HCT, PLT in the last 72 hours. No results for input(s): NA, K, CL, CO2, GLUCOSE, BUN, CREATININE, CALCIUM in the last 72 hours.  Intake/Output Summary (Last 24 hours) at 12/15/2019 1400 Last data filed at 12/15/2019 0740 Gross per 24 hour  Intake 500 ml  Output --  Net 500 ml     Physical Exam: Vital Signs Blood pressure 138/89, pulse 73, temperature 97.9 F (36.6 C), temperature source Oral, resp. rate 20, SpO2 99 %. General: Alert and oriented x 3, No apparent distress HEENT: Head is normocephalic, atraumatic, PERRLA, EOMI, sclera anicteric, oral mucosa pink and moist, dentition intact, ext ear canals clear,  Neck: Supple without JVD or lymphadenopathy Heart: Reg rate and rhythm. No murmurs rubs or gallops Chest: CTA bilaterally without wheezes, rales, or rhonchi; no distress Abdomen: Soft, non-tender, non-distended, bowel sounds positive. Extremities: No clubbing, cyanosis, or edema. Pulses are 2+ Skin: Clean and intact without signs of breakdown Neurologic motor strength is 4/5 in bilateral deltoid, bicep, tricep, grip, hip flexor, knee extensors, ankle dorsiflexor and plantar flexor, decreased LT in distal feet/toes Musculoskeletal: no pain with UE or LE active ROM  No joint swelling     Assessment/Plan: 1. Functional deficits secondary to CIM which require 3+ hours per day of interdisciplinary therapy in a comprehensive inpatient rehab setting.  Physiatrist is providing close team supervision and 24 hour management of active medical problems listed below.  Physiatrist and rehab team continue to assess barriers to discharge/monitor patient progress toward functional and medical goals  Care  Tool:  Bathing    Body parts bathed by patient: Front perineal area, Face, Right arm, Left arm, Chest, Abdomen, Buttocks, Right upper leg, Left upper leg, Right lower leg, Left lower leg   Body parts bathed by helper: Front perineal area, Buttocks, Right upper leg, Left upper leg, Right lower leg, Left lower leg Body parts n/a: Right arm, Left arm, Chest, Abdomen   Bathing assist Assist Level: Moderate Assistance - Patient 50 - 74%     Upper Body Dressing/Undressing Upper body dressing   What is the patient wearing?: Pull over shirt    Upper body assist Assist Level: Supervision/Verbal cueing    Lower Body Dressing/Undressing Lower body dressing      What is the patient wearing?: Pants     Lower body assist Assist for lower body dressing: Maximal Assistance - Patient 25 - 49%     Toileting Toileting    Toileting assist Assist for toileting: Moderate Assistance - Patient 50 - 74%     Transfers Chair/bed transfer  Transfers assist     Chair/bed transfer assist level: Contact Guard/Touching assist (Slideboard)     Locomotion Ambulation   Ambulation assist   Ambulation activity did not occur: Safety/medical concerns          Walk 10 feet activity   Assist  Walk 10 feet activity did not occur: Safety/medical concerns        Walk 50 feet activity   Assist Walk 50 feet with 2 turns activity did not occur: Safety/medical concerns         Walk 150 feet activity   Assist Walk 150 feet activity did not occur: Safety/medical concerns  Walk 10 feet on uneven surface  activity   Assist Walk 10 feet on uneven surfaces activity did not occur: Safety/medical concerns         Wheelchair     Assist Will patient use wheelchair at discharge?: Yes Type of Wheelchair: Manual    Wheelchair assist level: Supervision/Verbal cueing Max wheelchair distance: 100    Wheelchair 50 feet with 2 turns activity    Assist         Assist Level: Supervision/Verbal cueing   Wheelchair 150 feet activity     Assist      Assist Level: Supervision/Verbal cueing (mult brief rest breaks)   Blood pressure 138/89, pulse 73, temperature 97.9 F (36.6 C), temperature source Oral, resp. rate 20, SpO2 99 %.  Medical Problem List and Plan: 1.  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  Continue prednisone with taper Currently 20mg  bid  -Currently with 5 L oxygen nasal cannula             -patient may  shower             -ELOS/Goals:  Mod I to supervision 9/10  Continue CIR PT, OT,` 2.  Antithrombotics: -DVT/anticoagulation: Bilateral lower extremity DVTs.  Patient will remain on Eliquis 5 mg twice daily for a minimum of 3 months total             -antiplatelet therapy: N/A 3. Pain Management: Hydrocodone 1 tablet every 6 hours as needed- pt said she took Percocet 10/325 mg QID at home- Added heat , Kpad. Well controlled 4. Mood: Clonazepam 0.5 mg 3 times daily, Paxil 40 mg daily, melatonin 6 mg nightly- will see if possible to wean  -would benefit from neuropsych referral to help deal with situational anxiety             -antipsychotic agents: N/A  5. Neuropsych: This patient is capable of making decisions on her own behalf. 6. Skin/Wound Care: Routine skin checks 7. Fluids/Electrolytes/Nutrition: Routine in and outs with follow-up chemistries. Added Boost BID as per patient's request 8.  Hypothyroidism.  Continue Synthroid 25 mcg Monday Wednesday Friday 9.  Hypertension.  Lasix 40 mg daily, metoprolol 50 mg twice daily Vitals:   12/14/19 1956 12/15/19 0409  BP:  138/89  Pulse:  73  Resp:  20  Temp:  97.9 F (36.6 C)  SpO2: 94% 99%  Well controlled 8/28 10.  Hyperlipidemia.  Lipitor 40 mg daily 11.  History of gastric bypass 2017.  Dietary follow-up 12.  History left TKA April 2021.  Weightbearing as tolerated- left knee actually hurts more than RIght  And needs TKR on R knee, per  pt. 13. Grounds pass if portable O2 is available   14.  Post infectious cough on Dulera, on Prednisone taper  LOS: 11 days A FACE TO FACE EVALUATION WAS PERFORMED  Ica Daye P Merridith Dershem 12/15/2019, 2:00 PM

## 2019-12-15 NOTE — Progress Notes (Signed)
Physical Therapy Session Note  Patient Details  Name: [name] MRN: [id number] Date of Birth: [birth year]  Today's Date: 12/15/2019 PT Individual Time: 1000-1100 PT Individual Time Calculation (min): 60 min   Short Term Goals: Week 2:  PT Short Term Goal 1 (Week 2): Pt will transfer to and from [facility] with supervision assist with SB PT Short Term Goal 2 (Week 2): Pt will perform sit<>stand with mod assist using RW PT Short Term Goal 3 (Week 2): Pt will ambulate 62f with mod assist in parallel bars  Skilled Therapeutic Interventions/Progress Updates:  Pt received sitting in WC and agreeable to PT. Pt maintained on 5-6L/min  [facility] treatment focused on sit<>stand  In parallel bars x 3 with mod-max assist and increased time. Noted to utilize UE primarily to push in to sitting. On fist bout performed partial squat x 4. Pt then performed reciprocal stepping on second bout in standing. SpO2 desat to 84% on each bout in standing and then increased to 93% after 2 minutes  Pt then transported to day room. Gait training in litegait x 35 + 422fwith increasing WB through BLE as pt increased distance to advance lite gait through [place]. Cues for posture increased step length and improved time on stance limb to increase activation of quads and glutes in BLE.   [facility] and [facility] and all needs met.         Therapy Documentation Precautions:  Precautions Precautions: Fall, Other (comment) Precaution Comments: 5-7L via [place] Restrictions Weight Bearing Restrictions: No   Pain: denies  Therapy/Group: Individual Therapy  AuLorie Phenix/28/2021, 11:52 PM

## 2019-12-15 NOTE — Plan of Care (Signed)
  Problem: Consults Goal: RH GENERAL PATIENT EDUCATION Description: See Patient Education module for education specifics. Outcome: Progressing   Problem: RH BOWEL ELIMINATION Goal: RH STG MANAGE BOWEL WITH ASSISTANCE Description: STG Manage Bowel with sup Assistance. Outcome: Progressing   Problem: RH BLADDER ELIMINATION Goal: RH STG MANAGE BLADDER WITH ASSISTANCE Description: STG Manage Bladder With Supv Assistance Outcome: Progressing   Problem: RH SKIN INTEGRITY Goal: RH STG SKIN FREE OF INFECTION/BREAKDOWN Description: Skin will be free of infection/breakdown with min assist Outcome: Progressing Goal: RH STG MAINTAIN SKIN INTEGRITY WITH ASSISTANCE Description: STG Maintain Skin Integrity With min Assistance. Outcome: Progressing   Problem: RH SAFETY Goal: RH STG ADHERE TO SAFETY PRECAUTIONS W/ASSISTANCE/DEVICE Description: STG Adhere to Safety Precautions With MIN Assistance/Device. Outcome: Progressing   Problem: RH PAIN MANAGEMENT Goal: RH STG PAIN MANAGED AT OR BELOW PT'S PAIN GOAL Description: Pain will be managed at 3 out of 10 at MOD I  Outcome: Progressing   Problem: RH KNOWLEDGE DEFICIT GENERAL Goal: RH STG INCREASE KNOWLEDGE OF SELF CARE AFTER HOSPITALIZATION Description: Pt will be able to verbalize self care related to handouts, therapy, and personalization for independent care with min assist  Outcome: Progressing

## 2019-12-16 MED ORDER — DOCUSATE SODIUM 100 MG PO CAPS
100.0000 mg | ORAL_CAPSULE | Freq: Three times a day (TID) | ORAL | Status: DC
  Administered 2019-12-16 – 2019-12-26 (×15): 100 mg via ORAL
  Filled 2019-12-16 (×29): qty 1

## 2019-12-16 MED ORDER — CALCIUM CARBONATE ANTACID 500 MG PO CHEW
1.0000 | CHEWABLE_TABLET | Freq: Three times a day (TID) | ORAL | Status: DC | PRN
  Administered 2019-12-16 – 2019-12-17 (×2): 200 mg via ORAL
  Filled 2019-12-16 (×2): qty 1

## 2019-12-16 NOTE — Progress Notes (Signed)
[place] PHYSICAL MEDICINE & REHABILITATION PROGRESS NOTE   Subjective/Complaints: Feeling well.  Eating salad Asked about tickle in her throat- no evidence of thrush. Advised honey with tea at meals and she will request.   ROS: Patient denies CP, SOB, N/V/D  Objective:   No results found. No results for input(s): WBC, HGB, HCT, PLT in the last 72 hours. No results for input(s): NA, K, CL, CO2, GLUCOSE, BUN, CREATININE, CALCIUM in the last 72 hours.  Intake/Output Summary (Last 24 hours) at 12/16/2019 1338 Last data filed at 12/16/2019 0747 Gross per 24 hour  Intake 720 ml  Output --  Net 720 ml     Physical Exam: Vital Signs Blood pressure 127/90, pulse 76, temperature 97.8 F (36.6 C), temperature source Oral, resp. rate (!) 21, SpO2 99 %. General: Alert and oriented x 3, No apparent distress HEENT: Head is normocephalic, atraumatic, PERRLA, EOMI, sclera anicteric, oral mucosa pink and moist, dentition intact, ext ear canals clear,  Neck: Supple without JVD or lymphadenopathy Heart: Reg rate and rhythm. No murmurs rubs or gallops Chest: CTA bilaterally without wheezes, rales, or rhonchi; no distress Abdomen: Soft, non-tender, non-distended, bowel sounds positive. Extremities: No clubbing, cyanosis, or edema. Pulses are 2+ Skin: Clean and intact without signs of breakdown Neurologic motor strength is 4/5 in bilateral deltoid, bicep, tricep, grip, hip flexor, knee extensors, ankle dorsiflexor and plantar flexor, decreased LT in distal feet/toes Musculoskeletal: no pain with UE or LE active ROM  No joint swelling  Assessment/Plan: 1. Functional deficits secondary to CIM which require 3+ hours per day of interdisciplinary therapy in a comprehensive inpatient rehab setting.  Physiatrist is providing close team supervision and 24 hour management of active medical problems listed below.  Physiatrist and rehab team continue to assess barriers to discharge/monitor patient  progress toward functional and medical goals  Care Tool:  Bathing    Body parts bathed by patient: Front perineal area, Face, Right arm, Left arm, Chest, Abdomen, Buttocks, Right upper leg, Left upper leg, Right lower leg, Left lower leg   Body parts bathed by helper: Front perineal area, Buttocks, Right upper leg, Left upper leg, Right lower leg, Left lower leg Body parts n/a: Right arm, Left arm, Chest, Abdomen   Bathing assist Assist Level: Moderate Assistance - Patient 50 - 74%     Upper Body Dressing/Undressing Upper body dressing   What is the patient wearing?: Pull over shirt    Upper body assist Assist Level: Supervision/Verbal cueing    Lower Body Dressing/Undressing Lower body dressing      What is the patient wearing?: Pants     Lower body assist Assist for lower body dressing: Maximal Assistance - Patient 25 - 49%     Toileting Toileting    Toileting assist Assist for toileting: Moderate Assistance - Patient 50 - 74%     Transfers Chair/bed transfer  Transfers assist     Chair/bed transfer assist level: Contact Guard/Touching assist (Slideboard)     Locomotion Ambulation   Ambulation assist   Ambulation activity did not occur: Safety/medical concerns          Walk 10 feet activity   Assist  Walk 10 feet activity did not occur: Safety/medical concerns        Walk 50 feet activity   Assist Walk 50 feet with 2 turns activity did not occur: Safety/medical concerns         Walk 150 feet activity   Assist Walk 150 feet activity did  not occur: Safety/medical concerns         Walk 10 feet on uneven surface  activity   Assist Walk 10 feet on uneven surfaces activity did not occur: Safety/medical concerns         Wheelchair     Assist Will patient use wheelchair at discharge?: Yes Type of Wheelchair: Manual    Wheelchair assist level: Supervision/Verbal cueing Max wheelchair distance: 100    Wheelchair 50  feet with 2 turns activity    Assist        Assist Level: Supervision/Verbal cueing   Wheelchair 150 feet activity     Assist      Assist Level: Supervision/Verbal cueing (mult brief rest breaks)   Blood pressure 127/90, pulse 76, temperature 97.8 F (36.6 C), temperature source Oral, resp. rate (!) 21, SpO2 99 %.  Medical Problem List and Plan: 1.  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  Continue prednisone with taper Currently 20mg  bid  -Currently with 5 L oxygen nasal cannula             -patient may  shower             -ELOS/Goals:  Mod I to supervision 9/10  Continue CIR PT, OT,` 2.  Antithrombotics: -DVT/anticoagulation: Bilateral lower extremity DVTs.  Patient will remain on Eliquis 5 mg twice daily for a minimum of 3 months total             -antiplatelet therapy: N/A 3. Pain Management: Hydrocodone 1 tablet every 6 hours as needed- pt said she took Percocet 10/325 mg QID at home- Added heat , Kpad. Well controlled.  4. Mood: Clonazepam 0.5 mg 3 times daily, Paxil 40 mg daily, melatonin 6 mg nightly- will see if possible to wean  -would benefit from neuropsych referral to help deal with situational anxiety             -antipsychotic agents: N/A  5. Neuropsych: This patient is capable of making decisions on her own behalf. 6. Skin/Wound Care: Routine skin checks 7. Fluids/Electrolytes/Nutrition: Routine in and outs with follow-up chemistries. Added Boost BID as per patient's request 8.  Hypothyroidism.  Continue Synthroid 25 mcg Monday Wednesday Friday 9.  Hypertension.  Lasix 40 mg daily, metoprolol 50 mg twice daily Vitals:   12/15/19 1946 12/16/19 0411  BP: 116/69 127/90  Pulse: (!) 105 76  Resp: 18 (!) 21  Temp: 97.7 F (36.5 C) 97.8 F (36.6 C)  SpO2: 94% 99%  Well controlled 8/29 10.  Hyperlipidemia.  Lipitor 40 mg daily 11.  History of gastric bypass 2017.  Dietary follow-up 12.  History left TKA April 2021.  Weightbearing  as tolerated- left knee actually hurts more than RIght  And needs TKR on R knee, per pt. 13. Grounds pass if portable O2 is available  14.  Post infectious cough on Dulera, on Prednisone taper. Advised tea with honey LOS: 12 days A FACE TO FACE EVALUATION WAS PERFORMED  May 2021 Kearie Mennen 12/16/2019, 1:38 PM

## 2019-12-16 NOTE — Plan of Care (Signed)
  Problem: Consults Goal: RH GENERAL PATIENT EDUCATION Description: See Patient Education module for education specifics. Outcome: Progressing   Problem: RH BOWEL ELIMINATION Goal: RH STG MANAGE BOWEL WITH ASSISTANCE Description: STG Manage Bowel with sup Assistance. Outcome: Progressing   Problem: RH BLADDER ELIMINATION Goal: RH STG MANAGE BLADDER WITH ASSISTANCE Description: STG Manage Bladder With Supv Assistance Outcome: Progressing   Problem: RH SKIN INTEGRITY Goal: RH STG SKIN FREE OF INFECTION/BREAKDOWN Description: Skin will be free of infection/breakdown with min assist Outcome: Progressing Goal: RH STG MAINTAIN SKIN INTEGRITY WITH ASSISTANCE Description: STG Maintain Skin Integrity With min Assistance. Outcome: Progressing   Problem: RH SAFETY Goal: RH STG ADHERE TO SAFETY PRECAUTIONS W/ASSISTANCE/DEVICE Description: STG Adhere to Safety Precautions With MIN Assistance/Device. Outcome: Progressing   Problem: RH PAIN MANAGEMENT Goal: RH STG PAIN MANAGED AT OR BELOW PT'S PAIN GOAL Description: Pain will be managed at 3 out of 10 at MOD I  Outcome: Progressing   Problem: RH KNOWLEDGE DEFICIT GENERAL Goal: RH STG INCREASE KNOWLEDGE OF SELF CARE AFTER HOSPITALIZATION Description: Pt will be able to verbalize self care related to handouts, therapy, and personalization for independent care with min assist  Outcome: Progressing   

## 2019-12-17 ENCOUNTER — Inpatient Hospital Stay (HOSPITAL_COMMUNITY): Payer: Medicare Other | Admitting: Physical Therapy

## 2019-12-17 ENCOUNTER — Inpatient Hospital Stay (HOSPITAL_COMMUNITY): Payer: Medicare Other | Admitting: Occupational Therapy

## 2019-12-17 NOTE — Progress Notes (Signed)
Physical Therapy Session Note  Patient Details  Name: Bonnie Ballard MRN: [id number] Date of Birth: [birth year]  Today's Date: 12/17/2019 PT Individual Time: 1000-1100 and 1415-1539 PT Individual Time Calculation (min): 60 min and 83 min  Short Term Goals: Week 2:  PT Short Term Goal 1 (Week 2): Pt will transfer to and from [facility] with supervision assist with SB PT Short Term Goal 2 (Week 2): Pt will perform sit<>stand with mod assist using RW PT Short Term Goal 3 (Week 2): Pt will ambulate 62f with mod assist in parallel bars  Skilled Therapeutic Interventions/Progress Updates: Pt presented in w/c agreeable to therapy. Pt states has been coughing more last x 2 days but no SOB and feels may be contributed to tapering of steroid. Pt propelled to day room and set up with Lite Gait harness. Pt participated in body weight supported ambulation via Lite Gait. Pt ambulated 352fand 2562fith SpO2 dropping to 87% after activity however recovered within 1-2 minutes with HR 75-85 with activity. Pt did require approx 5 min for recovery between bouts. Pt then transported to rehab gym and participated in use of Zoom ball 2 bouts x 1 min for endurance as well as ball taps with 2lb dowel 2 x 20. Pt propelled back to room and performed SB transfer to bed with PTA setting up SB. Pt returned to supine mod I with use of bed features and was able to reposition to comfort. Pt left in bed with bed alarm on, call bell within reach and needs met.   Tx2: Pt presented in bed agreeable to therapy. Pt states stomach is more upset and feeling more nauseous than am but willing to try. Emotional support and rest breaks provided as needed. Pt performed supine to sit with supervision and use of bed features. With increased time pt donned sneakers with set up and performed SB transfer to w/c with PTA setting up board and supervision for transfers. Pt transported to gift shop for energy conservation when propelled w/c maneuvering safety  throughout store in small spaces and reversing when needed. Once pt out of gift shop required extended break for recovery and PTA transported back to unit. Pt then participated in UBE L4 2 bouts of 2 min each forwards and backwards. Pt required 2-3 recovery time between bouts but was able to maintain SpO2 >90%. Pt then indicated urgency for BM. Pt transported back to room and performed [facility] with set up and PTA provided minA for clothing management with pt performing lateral leans (+BM). Once completed pt was modI for peri-care but required modA for clothing management and set up for transfer back to w/c. Pt then propelled over to bed and performed SB transfer to bed with increased time due to fatigue and set up. Pt was supervision for sit to supine. Pt then agreeable to LE therex as follows: SAQ 2 x 10, SLE x 10, pelvic tilt with hip elevation (pt still unable to fully clear bed)  then pelvic tilt with adductor squeeze x 10 ea, and manually resisted leg press x 10. PTA placed bed in [place] and pt was [facility] re[facility] for comfort and left with bed alarm on, call bell within reach and current needs met.        Therapy Documentation Precautions:  Precautions Precautions: Fall, Other (comment) Precaution Comments: 5-7L via [place] Restrictions Weight Bearing Restrictions: No    Therapy/Group: Individual Therapy  Lovell Roe  Milla Wahlberg  Conley Pawling, PTA  12/17/2019, 12:57 PM

## 2019-12-17 NOTE — Progress Notes (Signed)
Occupational Therapy Session Note  Patient Details  Name: Bonnie Ballard MRN: [id number] Date of Birth: 04/24/1965  Today's Date: 12/17/2019 OT Individual Time: 3086-5784 OT Individual Time Calculation (min): 61 min    Short Term Goals: Week 2:  OT Short Term Goal 1 (Week 2): Patient will complete sit to stand with use of Stedy and Mod A. OT Short Term Goal 2 (Week 2): Patient will complete LB bathing seated EOB with Mod A and lateral leans. OT Short Term Goal 3 (Week 2): Patient will complete 2/3 parts of toileting task seated on BSC.  Skilled Therapeutic Interventions/Progress Updates:    Patient in bed, alert and ready for therapy session.  She denies pain but feels more fatigued this am and states that she is having more difficulty catching her breath - nursing aware.  On 5L O2 via [place], O2 sat difficult to read as hands cold and each registering different values - after warming up hands saturation at 92%, respiratory provided inhaler during session.  Supine to sitting edge of bed with CS.  She tolerated unsupported sitting for UB bathing and dressing with set up.  SB transfer bed to drop arm commode with CGA.  Min A for hygiene after BM.  LB bathing and dressing completed seated on commode with min A bathing and mod A for pants over feet this session.  CM with lateral leans mod A.  SB transfer commode to w/c CGA.  She completed grooming tasks seated in w/c at sink with set up.  She reports to be feeling better at close of session.  She remained seated in w/c, call bell and tray table in reach.    Therapy Documentation Precautions:  Precautions Precautions: Fall, Other (comment) Precaution Comments: 5-7L via [place] Restrictions Weight Bearing Restrictions: No  Therapy/Group: Individual Therapy  Barrie Lyme 12/17/2019, 7:30 AM

## 2019-12-17 NOTE — Plan of Care (Signed)
  Problem: Consults Goal: RH GENERAL PATIENT EDUCATION Description: See Patient Education module for education specifics. Outcome: Progressing   Problem: RH BOWEL ELIMINATION Goal: RH STG MANAGE BOWEL WITH ASSISTANCE Description: STG Manage Bowel with sup Assistance. Outcome: Progressing   Problem: RH BLADDER ELIMINATION Goal: RH STG MANAGE BLADDER WITH ASSISTANCE Description: STG Manage Bladder With Supv Assistance Outcome: Progressing   Problem: RH SKIN INTEGRITY Goal: RH STG SKIN FREE OF INFECTION/BREAKDOWN Description: Skin will be free of infection/breakdown with min assist Outcome: Progressing Goal: RH STG MAINTAIN SKIN INTEGRITY WITH ASSISTANCE Description: STG Maintain Skin Integrity With min Assistance. Outcome: Progressing   Problem: RH SAFETY Goal: RH STG ADHERE TO SAFETY PRECAUTIONS W/ASSISTANCE/DEVICE Description: STG Adhere to Safety Precautions With MIN Assistance/Device. Outcome: Progressing   Problem: RH PAIN MANAGEMENT Goal: RH STG PAIN MANAGED AT OR BELOW PT'S PAIN GOAL Description: Pain will be managed at 3 out of 10 at MOD I  Outcome: Progressing   Problem: RH KNOWLEDGE DEFICIT GENERAL Goal: RH STG INCREASE KNOWLEDGE OF SELF CARE AFTER HOSPITALIZATION Description: Pt will be able to verbalize self care related to handouts, therapy, and personalization for independent care with min assist  Outcome: Progressing   

## 2019-12-17 NOTE — Progress Notes (Signed)
[place] PHYSICAL MEDICINE & REHABILITATION PROGRESS NOTE   Subjective/Complaints:  No issues except freq cough (feels like phlegm in throat)   ROS: Patient denies CP, SOB, N/V/D  Objective:   No results found. No results for input(s): WBC, HGB, HCT, PLT in the last 72 hours. No results for input(s): NA, K, CL, CO2, GLUCOSE, BUN, CREATININE, CALCIUM in the last 72 hours.  Intake/Output Summary (Last 24 hours) at 12/17/2019 1059 Last data filed at 12/17/2019 0900 Gross per 24 hour  Intake 837 ml  Output --  Net 837 ml     Physical Exam: Vital Signs Blood pressure 123/85, pulse 78, temperature 97.6 F (36.4 C), temperature source Oral, resp. rate (!) 21, SpO2 99 %.  General: No acute distress Mood and affect are appropriate Heart: Regular rate and rhythm no rubs murmurs or extra sounds Lungs: Clear to auscultation, breathing unlabored, no rales or wheezes Abdomen: Positive bowel sounds, soft nontender to palpation, nondistended Extremities: No clubbing, cyanosis, or edema Skin: No evidence of breakdown, no evidence of rash Neurologic: Cranial nerves II through XII intact, motor strength is 5/5 in bilateral deltoid, bicep, tricep, grip, hip flexor, knee extensors, ankle dorsiflexor and plantar flexor 3/5 Hip ext  Musculoskeletal: Full range of motion in all 4 extremities. No joint swelling  Assessment/Plan: 1. Functional deficits secondary to CIM which require 3+ hours per day of interdisciplinary therapy in a comprehensive inpatient rehab setting.  Physiatrist is providing close team supervision and 24 hour management of active medical problems listed below.  Physiatrist and rehab team continue to assess barriers to discharge/monitor patient progress toward functional and medical goals  Care Tool:  Bathing    Body parts bathed by patient: Front perineal area, Face, Right arm, Left arm, Chest, Abdomen, Buttocks, Right upper leg, Left upper leg, Right lower leg, Left  lower leg   Body parts bathed by helper: Front perineal area, Buttocks, Right upper leg, Left upper leg, Right lower leg, Left lower leg Body parts n/a: Right arm, Left arm, Chest, Abdomen   Bathing assist Assist Level: Moderate Assistance - Patient 50 - 74%     Upper Body Dressing/Undressing Upper body dressing   What is the patient wearing?: Pull over shirt    Upper body assist Assist Level: Supervision/Verbal cueing    Lower Body Dressing/Undressing Lower body dressing      What is the patient wearing?: Pants     Lower body assist Assist for lower body dressing: Maximal Assistance - Patient 25 - 49%     Toileting Toileting    Toileting assist Assist for toileting: Moderate Assistance - Patient 50 - 74%     Transfers Chair/bed transfer  Transfers assist     Chair/bed transfer assist level: Contact Guard/Touching assist (Slideboard)     Locomotion Ambulation   Ambulation assist   Ambulation activity did not occur: Safety/medical concerns          Walk 10 feet activity   Assist  Walk 10 feet activity did not occur: Safety/medical concerns        Walk 50 feet activity   Assist Walk 50 feet with 2 turns activity did not occur: Safety/medical concerns         Walk 150 feet activity   Assist Walk 150 feet activity did not occur: Safety/medical concerns         Walk 10 feet on uneven surface  activity   Assist Walk 10 feet on uneven surfaces activity did not occur: Safety/medical concerns  Wheelchair     Assist Will patient use wheelchair at discharge?: Yes Type of Wheelchair: Manual    Wheelchair assist level: Supervision/Verbal cueing Max wheelchair distance: 100    Wheelchair 50 feet with 2 turns activity    Assist        Assist Level: Supervision/Verbal cueing   Wheelchair 150 feet activity     Assist      Assist Level: Supervision/Verbal cueing (mult brief rest breaks)   Blood pressure  123/85, pulse 78, temperature 97.6 F (36.4 C), temperature source Oral, resp. rate (!) 21, SpO2 99 %.  Medical Problem List and Plan: 1.  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  Continue prednisone with taper Currently 20mg  daily - add flutter valve  -Currently with 5 L oxygen nasal cannula             -patient may  shower             -ELOS/Goals:  Mod I to supervision 9/10  Continue CIR PT, OT,` 2.  Antithrombotics: -DVT/anticoagulation: Bilateral lower extremity DVTs.  Patient will remain on Eliquis 5 mg twice daily for a minimum of 3 months total             -antiplatelet therapy: N/A 3. Pain Management: Hydrocodone 1 tablet every 6 hours as needed- pt said she took Percocet 10/325 mg QID at home- Added heat , Kpad. Well controlled.  4. Mood: Clonazepam 0.5 mg 3 times daily, Paxil 40 mg daily, melatonin 6 mg nightly- will see if possible to wean  -would benefit from neuropsych referral to help deal with situational anxiety             -antipsychotic agents: N/A  5. Neuropsych: This patient is capable of making decisions on her own behalf. 6. Skin/Wound Care: Routine skin checks 7. Fluids/Electrolytes/Nutrition: Routine in and outs with follow-up chemistries. Added Boost BID as per patient's request 8.  Hypothyroidism.  Continue Synthroid 25 mcg Monday Wednesday Friday 9.  Hypertension.  Lasix 40 mg daily, metoprolol 50 mg twice daily Vitals:   12/17/19 0425 12/17/19 0842  BP: 123/85   Pulse: 78   Resp: (!) 21   Temp: 97.6 F (36.4 C)   SpO2: 97% 99%  Well controlled 8/30 10.  Hyperlipidemia.  Lipitor 40 mg daily 11.  History of gastric bypass 2017.  Dietary follow-up 12.  History left TKA April 2021.  Weightbearing as tolerated- left knee actually hurts more than RIght  And needs TKR on R knee, per pt. 13. Grounds pass if portable O2 is available  14.  Post infectious cough on Dulera, on Prednisone taper. Advised tea with honey, add flutter  valve LOS: 13 days A FACE TO FACE EVALUATION WAS PERFORMED  May 2021 12/17/2019, 10:59 AM

## 2019-12-18 ENCOUNTER — Inpatient Hospital Stay (HOSPITAL_COMMUNITY): Payer: Medicare Other | Admitting: Physical Therapy

## 2019-12-18 ENCOUNTER — Inpatient Hospital Stay (HOSPITAL_COMMUNITY): Payer: Medicare Other | Admitting: Occupational Therapy

## 2019-12-18 NOTE — Progress Notes (Signed)
Physical Therapy Session Note  Patient Details  Name: Bonnie Ballard MRN: [id number] Date of Birth: 1965-11-18  Today's Date: 12/18/2019 PT Individual Time: 0900-1015 PT Individual Time Calculation (min): 75 min   Short Term Goals: Week 2:  PT Short Term Goal 1 (Week 2): Pt will transfer to and from [facility] with supervision assist with SB PT Short Term Goal 2 (Week 2): Pt will perform sit<>stand with mod assist using RW PT Short Term Goal 3 (Week 2): Pt will ambulate 77f with mod assist in parallel bars  Skilled Therapeutic Interventions/Progress Updates: Pt presented in bed agreeable to therapy. Performed supine to sit with supervision and use of bed features. Pt donned shoes with set up and increased time due to frequent rest breaks needed. Performed SB transfer to w/c with PTA setting up SB and pt performing transfer with supervision. Pt performed oral hygiene and washed face at sink. Pt then transported to rehab gym and performed SB transfer to high/low mat in same manner as prior. Pt set up in standing sling and participated in raising mat with heavy duty RW in front of pt and pt making increased effort to bear wt through BLE. Pt was able to achieve near standing but was unable to power up to complete stand. Pt was able to perform x 3 trials before becoming too fatigued. After rest pt returned to w/c in same manner as prior and transported to day room. Performed SB transfer to NuStep CGA (uphill) and participated in NuStep L1 using x 4 extremities x 2 min for general conditioning. Pt indicated 11 on Borg scale after 2 min. Pt then performed additional 2 x 2 min bouts BLE only with pt indicating 13 on Borg scale. Pt required x 2-3 min rest between bouts. Pt transferred back to w/c in same manner as prior and transported back to room. Pt left in w/c at end of session with call bell within reach and needs met.      Therapy Documentation Precautions:  Precautions Precautions: Fall, Other  (comment) Precaution Comments: 5-7L via [place] Restrictions Weight Bearing Restrictions: No   Therapy/Group: Individual Therapy  Candy Leverett  Shelley Cocke, PTA  12/18/2019, 4:24 PM

## 2019-12-18 NOTE — Progress Notes (Signed)
Occupational Therapy Session Note  Patient Details  Name: Bonnie Ballard MRN: [id number] Date of Birth: 12/19/1965  Today's Date: 12/18/2019 OT Individual Time: 1102-1200 OT Individual Time Calculation (min): 58 min   OT Individual Time: 1305-1400 OT Individual Time Calculation (min):  min    Short Term Goals: Week 2:  OT Short Term Goal 1 (Week 2): Patient will complete sit to stand with use of Stedy and Mod A. OT Short Term Goal 2 (Week 2): Patient will complete LB bathing seated EOB with Mod A and lateral leans. OT Short Term Goal 3 (Week 2): Patient will complete 2/3 parts of toileting task seated on BSC.  Skilled Therapeutic Interventions/Progress Updates:  Session 1: Patient met seated in wc in agreement with OT treatment session with focus on self-care re-education, functional transfers, and BUE strengthening as detailed below. Patient demonstrates functional transfers with use of SB and close supervision A. Seated on wide drop-arm BSC, patient able to pull pants down off hips but required assist to pull pants down in front. Patient also able to complete hygiene but required assist to pull pants over bottom. Patient able to gather laundry from drawer in prep for laundry task. Wc mobility to patient laundry room without assist. With use of reacher, patient able to load washing machine and push start button from wc level. Seated on mat table, patient able to transition from edge of mat <> supine with CGA. In prone, patient engaged in [place] with ability to bring weight onto elbows x10 reps in prep for quadruped position in subsequent treatment sessions. Session concluded with patient seated in wc with call bell within reach and all needs met.  Session 2: Afternoon session with focus on self-care re-education with incorporation of energy conservation techniques. Seated at sink level, patient able to shampoo and condition hair with rest breaks 2/2 fatigue. Patient also able to detangle  hair with a comb and brush with frequent rest breaks. Patient with need to void. SB transfer wc <> wide drop-arm BSC with supervision A after placement of board. Toileting/hygiene/clothing management with Mod A and increased time 2/2 fatigue. Session concluded with patient seated in wc at sink to blow dry hair with call bell within reach and all needs met. NT aware of patient's position.   Therapy Documentation Precautions:  Precautions Precautions: Fall, Other (comment) Precaution Comments: 5-7L via [place] Restrictions Weight Bearing Restrictions: No General:    Therapy/Group: Individual Therapy  Bonnie Ballard 12/18/2019, 7:23 AM

## 2019-12-18 NOTE — Progress Notes (Signed)
[place] PHYSICAL MEDICINE & REHABILITATION PROGRESS NOTE   Subjective/Complaints:  Did not receive flutter valve yet , still with nagging cough, occ (not witnessed during visit)   ROS: Patient denies CP, SOB, N/V/D  Objective:   No results found. No results for input(s): WBC, HGB, HCT, PLT in the last 72 hours. No results for input(s): NA, K, CL, CO2, GLUCOSE, BUN, CREATININE, CALCIUM in the last 72 hours.  Intake/Output Summary (Last 24 hours) at 12/18/2019 0829 Last data filed at 12/18/2019 0758 Gross per 24 hour  Intake 480 ml  Output --  Net 480 ml     Physical Exam: Vital Signs Blood pressure (!) 137/91, pulse 79, temperature 98.2 F (36.8 C), temperature source Oral, resp. rate 17, SpO2 97 %.  General: No acute distress Mood and affect are appropriate Heart: Regular rate and rhythm no rubs murmurs or extra sounds Lungs: Clear to auscultation, breathing unlabored, no rales or wheezes Abdomen: Positive bowel sounds, soft nontender to palpation, nondistended Extremities: No clubbing, cyanosis, or edema Skin: No evidence of breakdown, no evidence of rash   Neurologic: Cranial nerves II through XII intact, motor strength is 5/5 in bilateral deltoid, bicep, tricep, grip, hip flexor, knee extensors, ankle dorsiflexor and plantar flexor 3/5 Hip ext  Musculoskeletal: Full range of motion in all 4 extremities. No joint swelling  Assessment/Plan: 1. Functional deficits secondary to CIM which require 3+ hours per day of interdisciplinary therapy in a comprehensive inpatient rehab setting.  Physiatrist is providing close team supervision and 24 hour management of active medical problems listed below.  Physiatrist and rehab team continue to assess barriers to discharge/monitor patient progress toward functional and medical goals  Care Tool:  Bathing    Body parts bathed by patient: Front perineal area, Face, Right arm, Left arm, Chest, Abdomen, Buttocks, Right upper leg,  Left upper leg, Right lower leg, Left lower leg   Body parts bathed by helper: Front perineal area, Buttocks, Right upper leg, Left upper leg, Right lower leg, Left lower leg Body parts n/a: Right arm, Left arm, Chest, Abdomen   Bathing assist Assist Level: Moderate Assistance - Patient 50 - 74%     Upper Body Dressing/Undressing Upper body dressing   What is the patient wearing?: Pull over shirt    Upper body assist Assist Level: Supervision/Verbal cueing    Lower Body Dressing/Undressing Lower body dressing      What is the patient wearing?: Pants     Lower body assist Assist for lower body dressing: Maximal Assistance - Patient 25 - 49%     Toileting Toileting    Toileting assist Assist for toileting: Moderate Assistance - Patient 50 - 74%     Transfers Chair/bed transfer  Transfers assist     Chair/bed transfer assist level: Contact Guard/Touching assist (Slideboard)     Locomotion Ambulation   Ambulation assist   Ambulation activity did not occur: Safety/medical concerns          Walk 10 feet activity   Assist  Walk 10 feet activity did not occur: Safety/medical concerns        Walk 50 feet activity   Assist Walk 50 feet with 2 turns activity did not occur: Safety/medical concerns         Walk 150 feet activity   Assist Walk 150 feet activity did not occur: Safety/medical concerns         Walk 10 feet on uneven surface  activity   Assist Walk 10 feet on  uneven surfaces activity did not occur: Safety/medical concerns         Wheelchair     Assist Will patient use wheelchair at discharge?: Yes Type of Wheelchair: Manual    Wheelchair assist level: Supervision/Verbal cueing Max wheelchair distance: 100    Wheelchair 50 feet with 2 turns activity    Assist        Assist Level: Supervision/Verbal cueing   Wheelchair 150 feet activity     Assist      Assist Level: Supervision/Verbal cueing (mult  brief rest breaks)   Blood pressure (!) 137/91, pulse 79, temperature 98.2 F (36.8 C), temperature source Oral, resp. rate 17, SpO2 97 %.  Medical Problem List and Plan: 1.  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  Continue prednisone with taper Currently 20mg  daily - add flutter valve  -Currently with 5 L oxygen nasal cannula             -patient may  shower             -ELOS/Goals:  Mod I to supervision 9/10  Continue CIR PT, OT,` 2.  Antithrombotics: -DVT/anticoagulation: Bilateral lower extremity DVTs.  Patient will remain on Eliquis 5 mg twice daily for a minimum of 3 months total             -antiplatelet therapy: N/A 3. Pain Management: Hydrocodone 1 tablet every 6 hours as needed- pt said she took Percocet 10/325 mg QID at home- Added heat , Kpad. Well controlled.  4. Mood: Clonazepam 0.5 mg 3 times daily, Paxil 40 mg daily, melatonin 6 mg nightly- will see if possible to wean  -would benefit from neuropsych referral to help deal with situational anxiety             -antipsychotic agents: N/A  5. Neuropsych: This patient is capable of making decisions on her own behalf. 6. Skin/Wound Care: Routine skin checks 7. Fluids/Electrolytes/Nutrition: Routine in and outs with follow-up chemistries. Added Boost BID as per patient's request 8.  Hypothyroidism.  Continue Synthroid 25 mcg Monday Wednesday Friday 9.  Hypertension.  Lasix 40 mg daily, metoprolol 50 mg twice daily Vitals:   12/17/19 2124 12/18/19 0311  BP: 116/69 (!) 137/91  Pulse: (!) 107 79  Resp:  17  Temp:  98.2 F (36.8 C)  SpO2:  97%  mild increase in diastolic will monitor  10.  Hyperlipidemia.  Lipitor 40 mg daily 11.  History of gastric bypass 2017.  Dietary follow-up 12.  History left TKA April 2021.  Weightbearing as tolerated- left knee actually hurts more than RIght  And needs TKR on R knee, per pt. 13. Grounds pass if portable O2 is available  14.  Post infectious cough on Dulera,  on Prednisone taper. Advised tea with honey, add flutter valve LOS: 14 days A FACE TO FACE EVALUATION WAS PERFORMED  May 2021 12/18/2019, 8:29 AM

## 2019-12-18 NOTE — Progress Notes (Addendum)
Pt concerned about her "feeling sick". Pt reports having abd pain for a few days with w/ multiple BMs, ongoing headache, productive cough,  and green/yellow nasal drainage

## 2019-12-19 ENCOUNTER — Ambulatory Visit (HOSPITAL_COMMUNITY): Payer: Medicare Other | Admitting: *Deleted

## 2019-12-19 ENCOUNTER — Inpatient Hospital Stay (HOSPITAL_COMMUNITY): Payer: Medicare Other | Admitting: Occupational Therapy

## 2019-12-19 ENCOUNTER — Inpatient Hospital Stay (HOSPITAL_COMMUNITY): Payer: Medicare Other | Admitting: Physical Therapy

## 2019-12-19 ENCOUNTER — Inpatient Hospital Stay (HOSPITAL_COMMUNITY): Payer: Medicare Other

## 2019-12-19 LAB — CBC
HCT: 38.5 % (ref 36.0–46.0)
Hemoglobin: 12.2 g/dL (ref 12.0–15.0)
MCH: 29.2 pg (ref 26.0–34.0)
MCHC: 31.7 g/dL (ref 30.0–36.0)
MCV: 92.1 fL (ref 80.0–100.0)
Platelets: 240 10*3/uL (ref 150–400)
RBC: 4.18 MIL/uL (ref 3.87–5.11)
RDW: 19.4 % — ABNORMAL HIGH (ref 11.5–15.5)
WBC: 15.3 10*3/uL — ABNORMAL HIGH (ref 4.0–10.5)
nRBC: 0.3 % — ABNORMAL HIGH (ref 0.0–0.2)

## 2019-12-19 LAB — BASIC METABOLIC PANEL
Anion gap: 12 (ref 5–15)
BUN: 15 mg/dL (ref 6–20)
CO2: 29 mmol/L (ref 22–32)
Calcium: 9 mg/dL (ref 8.9–10.3)
Chloride: 100 mmol/L (ref 98–111)
Creatinine, Ser: 0.72 mg/dL (ref 0.44–1.00)
GFR calc Af Amer: >60 mL/min (ref >60)
GFR calc non Af Amer: >60 mL/min (ref >60)
Glucose, Bld: 166 mg/dL — ABNORMAL HIGH (ref 70–99)
Potassium: 2.8 mmol/L — ABNORMAL LOW (ref 3.5–5.1)
Sodium: 141 mmol/L (ref 135–145)

## 2019-12-19 MED ORDER — ACETAMINOPHEN 325 MG PO TABS
650.0000 mg | ORAL_TABLET | Freq: Four times a day (QID) | ORAL | Status: DC | PRN
  Administered 2019-12-19 – 2019-12-23 (×2): 650 mg via ORAL
  Filled 2019-12-19 (×2): qty 2

## 2019-12-19 MED ORDER — ONDANSETRON HCL 4 MG PO TABS
4.0000 mg | ORAL_TABLET | Freq: Once | ORAL | Status: AC
  Administered 2019-12-19: 4 mg via ORAL
  Filled 2019-12-19: qty 1

## 2019-12-19 NOTE — Progress Notes (Signed)
Patient called nurse into the room about nausea onset and feeling pressure in her sinuses (waiting on sinus CT to result back). States she feels "miserable". Patient denies SOB. Order obtained for zofran.

## 2019-12-19 NOTE — Progress Notes (Signed)
Occupational Therapy Weekly Progress Note  Patient Details  Name: Bonnie Ballard MRN: [id number] Date of Birth: 04/07/1966  Beginning of progress report period: December 13, 2019 End of progress report period: December 19, 2019  Today's Date: 12/19/2019 OT Individual Time: 0730-0815 OT Individual Time Calculation (min): 45 min    Patient has met 2 of 3 short term goals. Patient is making steady progress toward goals demonstrating improvements in activity tolerance and increased independence with self care tasks including LB dressing and toileting. Upon evaluation, patient was requiring Max A for toileting/hygiene/clothing management. Patient currently able to complete 2/3 parts of toileting task with assist to hike pants over buttocks seated on drop-arm BSC only. Patient also demonstrates LB bathing in supine with lateral leans and set-up assist. Subsequent STG update to include LB bathing at EOB with Min A. Patient unable to tolerate Stedy with sit to stand goal discontinued.   Patient continues to demonstrate the following deficits: muscle weakness and decreased cardiorespiratoy endurance and decreased oxygen support and therefore will continue to benefit from skilled OT intervention to enhance overall performance with BADL.  Patient progressing toward long term goals..  Continue plan of care.  OT Short Term Goals Week 2:  OT Short Term Goal 1 (Week 2): Patient will complete sit to stand with use of Stedy and Mod A. OT Short Term Goal 1 - Progress (Week 2): Discontinued (comment) (Patient unable to tolerate Stedy 2/2 knee pain.) OT Short Term Goal 2 (Week 2): Patient will complete LB bathing seated EOB with Mod A and lateral leans. OT Short Term Goal 2 - Progress (Week 2): Met OT Short Term Goal 3 (Week 2): Patient will complete 2/3 parts of toileting task seated on BSC. OT Short Term Goal 3 - Progress (Week 2): Met Week 3:  OT Short Term Goal 1 (Week 3): Patient will complete LB bathing seated  EOB with Min A and lateral leans. OT Short Term Goal 2 (Week 3): Patient will complete 3/3 parts of toileting task seated on BSC. OT Short Term Goal 3 (Week 3): Patient will complete tub/shower transfer to TTB in prep for bathing task.  Skilled Therapeutic Interventions/Progress Updates:  Patient met lying supine in bed in agreement with OT treatment session with focus on self-care re-education and cardiopulmonary endurance. OT assisted patient off bedpan requiring set-up assist for hygiene management. Supine to EOB with supervision A and heavy use of bed features. Patient completed LB bathing with set-up A and LB dressing with Mod A requiring assist to hike pants over buttocks utilizing lateral leans only. Extended rest breaks required 2/2 fatigue as patient c/o not feeling well. SB transfer to wc with supervision A after set-up of board. Session concluded with patient seated in wc with call bell within reach and all needs met.   Therapy Documentation Precautions:  Precautions Precautions: Fall, Other (comment) Precaution Comments: 5-7L via [place] Restrictions Weight Bearing Restrictions: No General:    Therapy/Group: Individual Therapy  Destanae R Howerton-Davis 12/19/2019, 7:29 AM   

## 2019-12-19 NOTE — Progress Notes (Signed)
Patient ID: Bonnie Ballard, female   DOB: 1965/08/15, 54 y.o.   MRN: [id number]  Team Conference Report to Patient/Family  Team Conference discussion was reviewed with the patient and caregiver, including goals, any changes in plan of care and target discharge date.  Patient and caregiver express understanding and are in agreement.  The patient has a target discharge date of 12/28/19.  [name] 12/19/2019, 1:44 PM

## 2019-12-19 NOTE — Progress Notes (Signed)
Physical Therapy Session Note  Patient Details  Name: Bonnie Ballard MRN: [id number] Date of Birth: May 25, 1965  Today's Date: 12/19/2019 PT Individual Time: 0920-1000 and 1030-1100 PT Individual Time Calculation (min): 40 min and 30 min   Short Term Goals: Week 1:  PT Short Term Goal 1 (Week 1): Pt will perform bed mobility with min assist consistently PT Short Term Goal 1 - Progress (Week 1): Met PT Short Term Goal 2 (Week 1): Pt will initiate gait training in parallel bars PT Short Term Goal 2 - Progress (Week 1): Progressing toward goal PT Short Term Goal 3 (Week 1): Pt will propell WC 19f with supervision assist PT Short Term Goal 3 - Progress (Week 1): Met PT Short Term Goal 4 (Week 1): [facility] PT Short Term Goal 4 - Progress (Week 1): Met Week 2:  PT Short Term Goal 1 (Week 2): [facility] PT Short Term Goal 2 (Week 2): Pt will perform sit<>stand with mod assist using RW PT Short Term Goal 3 (Week 2): Pt will ambulate 110fwith mod assist in parallel bars  Skilled Therapeutic Interventions/Progress Updates:  Session 1   Pt received sitting in WC and agreeable to PT. Pt transported to day room. Standing endurance tolerance/gait training in lite gait 2 x 3066fith min assist to aide in forward progression and steering of lite gait. Cues for improve WB through stance LE to propel lite gait and activate quad/glutes. Prolonged rest break following each bout of gait training with mild desat to 92% on 6L/min supplemental O2. Pt then perform UE endurance training/ trunkal control, expansion while engaging in Wii resort Archery. Cues for improved posture in sitting, improved shoulder ROM, and trunkal rotation. Patient returned to room and [facility] reach and all needs met.    Session 2 .  Pt received sitting in WC and agreeable to PT. Pt performed ADL task of putting  clothes in drawer that she had folded prior to PT. Minor SOB noted following cleaning task. SB transfer to bed with set up assist from PT with cues for sequencing of WC parts. Pt then performed supine therex: bridge x 10, SAQ x 12, hip abduction x 10, heel slides x 12. Pt required increased time following each exercise to recover due to mild SOB. Pt left supine in bed with call bell in reach and all needs met         Therapy Documentation Precautions:  Precautions Precautions: Fall, Other (comment) Precaution Comments: 5-7L via  Restrictions Weight Bearing Restrictions: No Vital Signs: Oxygen Therapy SpO2: 92 % O2 Device: Nasal Cannula O2 Flow Rate (L/min): 5 L/min Pain: Pain Assessment Pain Scale: 0-10 Pain Score: 4  Pain Type: Acute pain Pain Location: Generalized Pain Orientation: Lower Pain Descriptors / Indicators: Aching Pain Frequency: Constant Pain Onset: On-going Patients Stated Pain Goal: 2 Pain Intervention(s): Repositioned   Therapy/Group: Individual Therapy  AusLorie Phenix1/2021, 10:12 AM

## 2019-12-19 NOTE — Progress Notes (Signed)
[facility] PHYSICAL MEDICINE & REHABILITATION PROGRESS NOTE   Subjective/Complaints:  Sinus drainage , greenish  Hx of sinusitis  ROS: Patient denies CP, SOB, N/V/D  Objective:   No results found. No results for input(s): WBC, HGB, HCT, PLT in the last 72 hours. No results for input(s): NA, K, CL, CO2, GLUCOSE, BUN, CREATININE, CALCIUM in the last 72 hours.  Intake/Output Summary (Last 24 hours) at 12/19/2019 0814 Last data filed at 12/18/2019 1848 Gross per 24 hour  Intake 600 ml  Output --  Net 600 ml     Physical Exam: Vital Signs Blood pressure 114/78, pulse 87, temperature 97.9 F (36.6 C), resp. rate 19, height '5\' 7"'  (1.702 m), weight 103.4 kg, SpO2 92 %.  General: No acute distress Mood and affect are appropriate Heart: Regular rate and rhythm no rubs murmurs or extra sounds Lungs: Clear to auscultation, breathing unlabored, no rales or wheezes Abdomen: Positive bowel sounds, soft nontender to palpation, nondistended Extremities: No clubbing, cyanosis, or edema Skin: No evidence of breakdown, no evidence of rash   Neurologic: Cranial nerves II through XII intact, motor strength is 5/5 in bilateral deltoid, bicep, tricep, grip, hip flexor, knee extensors, ankle dorsiflexor and plantar flexor 3/5 Hip ext  Musculoskeletal: Full range of motion in all 4 extremities. No joint swelling  Assessment/Plan: 1. Functional deficits secondary to CIM which require 3+ hours per day of interdisciplinary therapy in a comprehensive inpatient rehab setting.  Physiatrist is providing close team supervision and 24 hour management of active medical problems listed below.  Physiatrist and rehab team continue to assess barriers to discharge/monitor patient progress toward functional and medical goals  Care Tool:  Bathing    Body parts bathed by patient: Front perineal area, Face, Right arm, Left arm, Chest, Abdomen, Buttocks, Right upper leg, Left upper leg, Right lower leg, Left  lower leg   Body parts bathed by helper: Front perineal area, Buttocks, Right upper leg, Left upper leg, Right lower leg, Left lower leg Body parts n/a: Right arm, Left arm, Chest, Abdomen   Bathing assist Assist Level: Moderate Assistance - Patient 50 - 74%     Upper Body Dressing/Undressing Upper body dressing   What is the patient wearing?: Pull over shirt    Upper body assist Assist Level: Supervision/Verbal cueing    Lower Body Dressing/Undressing Lower body dressing      What is the patient wearing?: Pants     Lower body assist Assist for lower body dressing: Maximal Assistance - Patient 25 - 49%     Toileting Toileting    Toileting assist Assist for toileting: Moderate Assistance - Patient 50 - 74%     Transfers Chair/bed transfer  Transfers assist     Chair/bed transfer assist level: Supervision/Verbal cueing (Slideboard)     Locomotion Ambulation   Ambulation assist   Ambulation activity did not occur: Safety/medical concerns          Walk 10 feet activity   Assist  Walk 10 feet activity did not occur: Safety/medical concerns        Walk 50 feet activity   Assist Walk 50 feet with 2 turns activity did not occur: Safety/medical concerns         Walk 150 feet activity   Assist Walk 150 feet activity did not occur: Safety/medical concerns         Walk 10 feet on uneven surface  activity   Assist Walk 10 feet on uneven surfaces activity did not occur:  Safety/medical concerns         Wheelchair     Assist Will patient use wheelchair at discharge?: Yes Type of Wheelchair: Manual    Wheelchair assist level: Supervision/Verbal cueing Max wheelchair distance: 100    Wheelchair 50 feet with 2 turns activity    Assist        Assist Level: Supervision/Verbal cueing   Wheelchair 150 feet activity     Assist      Assist Level: Supervision/Verbal cueing (mult brief rest breaks)   Blood pressure  114/78, pulse 87, temperature 97.9 F (36.6 C), resp. rate 19, height '5\' 7"'  (1.702 m), weight 103.4 kg, SpO2 92 %.  Medical Problem List and Plan: 1.  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  Continue prednisone with taper Currently 69m daily - add flutter valve  -Currently with 5 L oxygen nasal cannula             -patient may  shower             -ELOS/Goals:  Mod I to supervision 9/10  Team conference today please see physician documentation under team conference tab, met with team  to discuss problems,progress, and goals. Formulized individual treatment plan based on medical history, underlying problem and comorbidities.  2.  Antithrombotics: -DVT/anticoagulation: Bilateral lower extremity DVTs.  Patient will remain on Eliquis 5 mg twice daily for a minimum of 3 months total             -antiplatelet therapy: N/A 3. Pain Management: Hydrocodone 1 tablet every 6 hours as needed- pt said she took Percocet 10/325 mg QID at home- Added heat , Kpad. Well controlled.  4. Mood: Clonazepam 0.5 mg 3 times daily, Paxil 40 mg daily, melatonin 6 mg nightly- will see if possible to wean  -would benefit from neuropsych referral to help deal with situational anxiety             -antipsychotic agents: N/A  5. Neuropsych: This patient is capable of making decisions on her own behalf. 6. Skin/Wound Care: Routine skin checks 7. Fluids/Electrolytes/Nutrition: Routine in and outs with follow-up chemistries. Added Boost BID as per patient's request 8.  Hypothyroidism.  Continue Synthroid 25 mcg Monday Wednesday Friday 9.  Hypertension.  Lasix 40 mg daily, metoprolol 50 mg twice daily Vitals:   12/19/19 0448 12/19/19 0734  BP: 114/78   Pulse: 87   Resp:    Temp: 97.9 F (36.6 C)   SpO2: 90% 92%  mild increase in diastolic will monitor  10.  Hyperlipidemia.  Lipitor 40 mg daily 11.  History of gastric bypass 2017.  Dietary follow-up 12.  History left TKA April 2021.   Weightbearing as tolerated- left knee actually hurts more than RIght  And needs TKR on R knee, per pt. 13. Grounds pass if portable O2 is available  14.  Post infectious cough on Dulera, on Prednisone taper. Advised tea with honey, add flutter valve 15.  Acute vs chronic sinusitis with max sinus tenderness will check limited CT of sinuses LOS: 15 days A FACE TO FACE EVALUATION WAS PERFORMED  ACharlett Blake9/04/2019, 8:14 AM

## 2019-12-19 NOTE — Progress Notes (Signed)
Physical Therapy Session Note  Patient Details  Name: Bonnie Ballard MRN: [id number] Date of Birth: 05/11/1965  Today's Date: 12/19/2019 PT Individual Time: 8648-4720 PT Individual Time Calculation (min): 45 min   Short Term Goals: Week 2:  PT Short Term Goal 1 (Week 2): Pt will transfer to and from [facility] with supervision assist with SB PT Short Term Goal 2 (Week 2): Pt will perform sit<>stand with mod assist using RW PT Short Term Goal 3 (Week 2): Pt will ambulate 58f with mod assist in parallel bars  Skilled Therapeutic Interventions/Progress Updates: Pt presented in bed agreeable to therapy. Pt c/o pain and requesting pain meds, nsg notified and received during session. Pt indicated feeling more "off" this afternoon. Performed bed mobility with supervision and SB transfer with set up assist. Pt required increased rest breaks during each activity for recovery. Pt agreeable to attempt NuStep this session and progress from yesterday's time. Pt transported to day room and performed SB transfer w/c to NuStep. Pt noted to have DOE and SpO2 checked: 85% on 6L requiring approx 3 min for recovery. Pt performed NuStep x5 min total with pt requiring break after approx each minute. Pt desat first 3 breaks 85-88% with recovery 1-2 minutes. On last bout pt stayed above 90%. Pt returned to w/c in same manner as prior and transported back to room. Pt set up and performed SB transfer back to bed but required minA for doffing shoes. Pt returned to bed supervision with bed features and pt repositioned to comfort. Pt left in bed with alarm on, call bell within reach and needs met with nsg notified of pt's disposition.      Therapy Documentation Precautions:  Precautions Precautions: Fall, Other (comment) Precaution Comments: 5-7L via  Restrictions Weight Bearing Restrictions: No    Therapy/Group: Individual Therapy  Britten Parady  Keisy Strickler, PTA  12/19/2019, 3:54 PM

## 2019-12-19 NOTE — Patient Care Conference (Addendum)
Inpatient RehabilitationTeam Conference and Plan of Care Update Date: 12/19/2019   Time: 10:06 AM    Patient Name: Bonnie Ballard      Medical Record Number: [id number]  Date of Birth: 07-20-1965 Sex: Female         Room/Bed: 4W16C/4W16C-01 Payor Info: Payor: MEDICARE / Plan: MEDICARE PART A AND B / Product Type: *No Product type* /    Admit Date/Time:  12/04/2019  3:20 PM  Primary Diagnosis:  Intensive care (ICU) myopathy  Hospital Problems: Principal Problem:   Intensive care (ICU) myopathy Active Problems:   Pneumonia due to COVID-19 virus   Critical illness myopathy    Expected Discharge Date: Expected Discharge Date: 12/28/19  Team Members Present: Physician leading conference: Dr. Claudette Laws Care Coodinator Present: Chana Bode, RN, BSN, CRRN;Christina Kreamer, BSW Nurse Present: Margot Ables, LPN PT Present: Grier Rocher, PT OT Present: Lina Sayre, OT PPS Coordinator present : Edson Snowball, Park Breed, SLP     Current Status/Progress Goal Weekly Team Focus  Bowel/Bladder             Swallow/Nutrition/ Hydration             ADL's   Setup UB self care, Supervision bathing using lateral leans, Max A LB dressing, CGA slideboard transfers to [facility]  Min A overall  Activity tolerance, functional transfers, ADL retraining, pt education   Mobility   supervision bed mobilty, supervision SB transfers, supervision w/c mobility, continues to require maxA for STS, gait in Lite Gait 30-68ft  supervision bed mobility, minA transfers and minA short distance ambulation  BLE strengthening, endurance, standing tolerance   Communication             Safety/Cognition/ Behavioral Observations            Pain             Skin               Discharge Planning:  Patient goal to discharge home. Daughters in home to asisst   Team Discussion: Sinue pain ; MD ordered CT scan to r/o ? Sinusitis. Encourage use of flutter valve; continue to note desat with  activity; anticipate home O2 @ 4-6 L/Min.  Patient on target to meet rehab goals: Yes; making progress with therapy towards Min A goals and w/c level.  *See Care Plan and progress notes for long and short-term goals.   Revisions to Treatment Plan:  Practice transfers and endurance training Strengthening exercises for quads/glutes. PT downgraded goals for ambulation ROM exercises  Teaching Needs: Transfers, toileting, medications, etc  Current Barriers to Discharge: Home enviroment access/layout and Weight  Possible Resolutions to Barriers: Ramp recommended for home entrance with multi steps and left rail Bariatric [facility] for toileting      Medical Summary Current Status: starting to use flutter valve, + green sinus drainage, sinus pain  Barriers to Discharge: Medical stability;New oxygen;Weight   Possible Resolutions to Barriers/Weekly Focus: Cont prednisone wean, check sinus CT   Continued Need for Acute Rehabilitation Level of Care: The patient requires daily medical management by a physician with specialized training in physical medicine and rehabilitation for the following reasons: Direction of a multidisciplinary physical rehabilitation program to maximize functional independence : Yes Medical management of patient stability for increased activity during participation in an intensive rehabilitation regime.: Yes Analysis of laboratory values and/or radiology reports with any subsequent need for medication adjustment and/or medical intervention. : Yes   I attest that I was present, lead  the team conference, and concur with the assessment and plan of the team.   Pamelia Hoit 12/19/2019, 1:36 PM

## 2019-12-19 NOTE — Progress Notes (Signed)
Recreational Therapy Session Note  Patient Details  Name: Bonnie Ballard MRN: [id number] Date of Birth: 09-07-1965 Today's Date: 12/19/2019  Pain: no c/o Skilled Therapeutic Interventions/Progress Updates: Session focused on activity tolerance during co-treat with PT.  Pt in w/c with c/o of fatigue and not feeling her best but agreeable to therapy.  Pt questioning sinus issues, MD aware and addressing.  Pt transported to the dayroom and ambulated with the Lite Gait ~30'x2 with min assist. Transitioned to sitting w/c level for Wii game with supervision.  Pt stated that she enjoyed this last week and is considering purchasing one for leisure and in the hopes of aiding in building her activity tolerance in the future.  Therapy/Group: Individual Therapy  Bonnie Ballard 12/19/2019, 12:59 PM

## 2019-12-20 ENCOUNTER — Inpatient Hospital Stay (HOSPITAL_COMMUNITY): Payer: Medicare Other | Admitting: Physical Therapy

## 2019-12-20 ENCOUNTER — Inpatient Hospital Stay (HOSPITAL_COMMUNITY): Payer: Medicare Other | Admitting: *Deleted

## 2019-12-20 ENCOUNTER — Inpatient Hospital Stay (HOSPITAL_COMMUNITY): Payer: Medicare Other | Admitting: Occupational Therapy

## 2019-12-20 MED ORDER — AMOXICILLIN-POT CLAVULANATE 500-125 MG PO TABS
1.0000 | ORAL_TABLET | Freq: Three times a day (TID) | ORAL | Status: DC
  Administered 2019-12-20 – 2019-12-21 (×4): 500 mg via ORAL
  Filled 2019-12-20 (×6): qty 1

## 2019-12-20 MED ORDER — OXYMETAZOLINE HCL 0.05 % NA SOLN
1.0000 | Freq: Two times a day (BID) | NASAL | Status: DC
  Administered 2019-12-20 – 2019-12-28 (×17): 1 via NASAL
  Filled 2019-12-20: qty 30

## 2019-12-20 NOTE — Progress Notes (Signed)
Recreational Therapy Session Note  Patient Details  Name: Bonnie Ballard MRN: [id number] Date of Birth: 06/04/1965 Today's Date: 12/20/2019  Pain: c/o of previous upset stomach and sinus pain, team aware and treating Skilled Therapeutic Interventions/Progress Updates: Pt in bed upon arrival, somewhat tearful and frustrated with stomach and sinus issues & her inability to participate in therapies fully today.Pt did attempt to eat some yogurt but only able to handle a few bites.  Discussed potential activity modifications for remaining therapy session, pt stating she really wanted to participated.  Emotional support provided until PT arrived for next scheduled therapy session.  Therapy/Group: Individual Therapy   Shaylin Blatt 12/20/2019, 4:04 PM

## 2019-12-20 NOTE — Progress Notes (Signed)
Bonnie Ballard PHYSICAL MEDICINE & REHABILITATION PROGRESS NOTE   Subjective/Complaints:  Sinus drainage , greenish , reports having CT last noc No sweats or chills but just feels tired, fatigue today.  Reports history of recurrent sinusitis.  She has been treated in the past with Zithromax as well as Augmentin.   ROS: Patient denies CP, SOB, N/V/D  Objective:   No results found. Recent Labs    12/19/19 1119  WBC 15.3*  HGB 12.2  HCT 38.5  PLT 240   Recent Labs    12/19/19 1119  NA 141  K 2.8*  CL 100  CO2 29  GLUCOSE 166*  BUN 15  CREATININE 0.72  CALCIUM 9.0    Intake/Output Summary (Last 24 hours) at 12/20/2019 0924 Last data filed at 12/19/2019 1857 Gross per 24 hour  Intake 422 ml  Output --  Net 422 ml     Physical Exam: Vital Signs Blood pressure 117/67, pulse 84, temperature 98.7 F (37.1 C), resp. rate 16, height 5\' 7"  (1.702 m), weight 103.4 kg, SpO2 92 %.   General: No acute distress Mood and affect are appropriate Heart: Regular rate and rhythm no rubs murmurs or extra sounds Lungs: Clear to auscultation, breathing unlabored, no rales or wheezes Abdomen: Positive bowel sounds, soft nontender to palpation, nondistended Extremities: No clubbing, cyanosis, or edema  HEENT frontal and maxillary sinus tenderness to palpation.  No nasal discharge  Neurologic: Cranial nerves II through XII intact, motor strength is 5/5 in bilateral deltoid, bicep, tricep, grip, hip flexor, knee extensors, ankle dorsiflexor and plantar flexor 3/5 Hip ext  Musculoskeletal: Full range of motion in all 4 extremities. No joint swelling  Assessment/Plan: 1. Functional deficits secondary to CIM which require 3+ hours per day of interdisciplinary therapy in a comprehensive inpatient rehab setting.  Physiatrist is providing close team supervision and 24 hour management of active medical problems listed below.  Physiatrist and rehab team continue to assess barriers to  discharge/monitor patient progress toward functional and medical goals  Care Tool:  Bathing    Body parts bathed by patient: Front perineal area, Face, Right arm, Left arm, Chest, Abdomen, Right upper leg, Left upper leg, Right lower leg, Left lower leg, Buttocks   Body parts bathed by helper: Front perineal area, Buttocks, Right upper leg, Left upper leg, Right lower leg, Left lower leg Body parts n/a: Right arm, Left arm, Chest, Abdomen   Bathing assist Assist Level: Supervision/Verbal cueing (UB EOB and LB bedlevel)     Upper Body Dressing/Undressing Upper body dressing   What is the patient wearing?: Pull over shirt    Upper body assist Assist Level: Supervision/Verbal cueing    Lower Body Dressing/Undressing Lower body dressing      What is the patient wearing?: Pants     Lower body assist Assist for lower body dressing: Moderate Assistance - Patient 50 - 74%     Toileting Toileting    Toileting assist Assist for toileting: Moderate Assistance - Patient 50 - 74%     Transfers Chair/bed transfer  Transfers assist     Chair/bed transfer assist level: Supervision/Verbal cueing (Slideboard)     Locomotion Ambulation   Ambulation assist   Ambulation activity did not occur: Safety/medical concerns          Walk 10 feet activity   Assist  Walk 10 feet activity did not occur: Safety/medical concerns        Walk 50 feet activity   Assist Walk 50 feet with  2 turns activity did not occur: Safety/medical concerns         Walk 150 feet activity   Assist Walk 150 feet activity did not occur: Safety/medical concerns         Walk 10 feet on uneven surface  activity   Assist Walk 10 feet on uneven surfaces activity did not occur: Safety/medical concerns         Wheelchair     Assist Will patient use wheelchair at discharge?: Yes Type of Wheelchair: Manual    Wheelchair assist level: Supervision/Verbal cueing Max wheelchair  distance: 100    Wheelchair 50 feet with 2 turns activity    Assist        Assist Level: Supervision/Verbal cueing   Wheelchair 150 feet activity     Assist      Assist Level: Supervision/Verbal cueing (mult brief rest breaks)   Blood pressure 117/67, pulse 84, temperature 98.7 F (37.1 C), resp. rate 16, height 5\' 7"  (1.702 m), weight 103.4 kg, SpO2 92 %.  Medical Problem List and Plan: 1.  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  Continue prednisone with taper Currently 20mg  daily - add flutter valve  -Currently with 5 L oxygen nasal cannula             -patient may  shower             -ELOS/Goals:  Mod I to supervision 9/10    2.  Antithrombotics: -DVT/anticoagulation: Bilateral lower extremity DVTs.  Patient will remain on Eliquis 5 mg twice daily for a minimum of 3 months total             -antiplatelet therapy: N/A 3. Pain Management: Hydrocodone 1 tablet every 6 hours as needed- pt said she took Percocet 10/325 mg QID at home- Added heat , Kpad. Well controlled.  4. Mood: Clonazepam 0.5 mg 3 times daily, Paxil 40 mg daily, melatonin 6 mg nightly- will see if possible to wean  -would benefit from neuropsych referral to help deal with situational anxiety             -antipsychotic agents: N/A  5. Neuropsych: This patient is capable of making decisions on her own behalf. 6. Skin/Wound Care: Routine skin checks 7. Fluids/Electrolytes/Nutrition: Routine in and outs with follow-up chemistries. Added Boost BID as per patient's request 8.  Hypothyroidism.  Continue Synthroid 25 mcg Monday Wednesday Friday 9.  Hypertension.  Lasix 40 mg daily, metoprolol 50 mg twice daily Vitals:   12/19/19 2010 12/20/19 0436  BP: (!) 123/91 117/67  Pulse: 86 84  Resp: 16 16  Temp: 98.1 F (36.7 C) 98.7 F (37.1 C)  SpO2: 98% 92%  mild increase in diastolic will monitor  10.  Hyperlipidemia.  Lipitor 40 mg daily 11.  History of gastric bypass 2017.   Dietary follow-up 12.  History left TKA April 2021.  Weightbearing as tolerated- left knee actually hurts more than RIght  And needs TKR on R knee, per pt. 13. Grounds pass if portable O2 is available  14.  Post infectious cough on Dulera, on Prednisone taper. Advised tea with honey, add flutter valve 15.  Acute vs chronic sinusitis with max sinus tenderness maxillofacial CT of sinuses, start Augmentin, Afrin spray LOS: 16 days A FACE TO FACE EVALUATION WAS PERFORMED  2018 12/20/2019, 9:24 AM

## 2019-12-20 NOTE — Progress Notes (Signed)
Occupational Therapy Session Note  Patient Details  Name: Bonnie Ballard MRN: [id number] Date of Birth: 1965/08/12  Today's Date: 12/20/2019 OT Individual Time: 4235-3614 OT Individual Time Calculation (min): 45 min    Short Term Goals: Week 3:  OT Short Term Goal 1 (Week 3): Patient will complete LB bathing seated EOB with Min A and lateral leans. OT Short Term Goal 2 (Week 3): Patient will complete 3/3 parts of toileting task seated on BSC. OT Short Term Goal 3 (Week 3): Patient will complete tub/shower transfer to TTB in prep for bathing task.  Skilled Therapeutic Interventions/Progress Updates:    Patient in bed, c/o nausea and fatigue - she states that she wants to participate but cannot tolerate OOB due to vomiting earlier this am.  Completed light grooming tasks with set up.  She completed UB conditioning activities for 40 minutes of session with good effort, no resistance and fatigue noted.  She remained in bed at close of session stating that she felt a little better after completing exercise.  Bed alarm set and call bell in hand.    Therapy Documentation Precautions:  Precautions Precautions: Fall, Other (comment) Precaution Comments: 5-7L via [place] Restrictions Weight Bearing Restrictions: No  Therapy/Group: Individual Therapy  Barrie Lyme 12/20/2019, 7:43 AM

## 2019-12-20 NOTE — Progress Notes (Signed)
Physical Therapy Weekly Progress Note  Patient Details  Name: Bonnie Ballard MRN: [id number] Date of Birth: 04-18-1966  Beginning of progress report period: December 13, 2019 End of progress report period: December 20, 2019  Today's Date: 12/20/2019 PT Individual Time: 1941-7408 and 1500-1615 PT Individual Time Calculation (min): 15 min 75 min  Patient has met 1 of 3 short term goals.  Pt is making slower than expected progress towards LTG for house hold ambulation at min assist level, due to LE weakness and continued SOB with standing. Pt is on track to perform all bed level and WC level mobility at mod I to supervision assist level with use of SB and ramp to access home.   Patient continues to demonstrate the following deficits muscle weakness and muscle joint tightness, decreased cardiorespiratoy endurance and decreased oxygen support and decreased sitting balance, decreased standing balance and decreased balance strategies and therefore will continue to benefit from skilled PT intervention to increase functional independence with mobility.  Patient progressing toward long term goals..  Continue plan of care.  PT Short Term Goals Week 2:  PT Short Term Goal 1 (Week 2): Pt will transfer to and from [facility] with supervision assist with SB PT Short Term Goal 1 - Progress (Week 2): Met PT Short Term Goal 2 (Week 2): Pt will perform sit<>stand with mod assist using RW PT Short Term Goal 2 - Progress (Week 2): Progressing toward goal PT Short Term Goal 3 (Week 2): Pt will ambulate 70f with mod assist in parallel bars PT Short Term Goal 3 - Progress (Week 2): Progressing toward goal Week 3:  PT Short Term Goal 1 (Week 3): STG=LTG due to ELOS  Skilled Therapeutic Interventions/Progress Updates:  Session 1  Pt received supine in bed and agreeable to PT. Supine>sit transfer with supervision assist and HOB slightly elevated. Pt reports that she feels awful, and states that she has new n/v as well as severe  sinus pressure, limiting her ability to perform on this day. Pt returned to supine with increased time as supervision assist, noting increased sinus pressure when returning to supine. Pt left in bed with call bell in reach and all needs met.   Session 2.     Pt received supine in bed and agreeable to PT. Supine>sit transfer with supervision assist and increased time due to fatigue. NT then present to assess vital signs. See below for details. [facility].  Sit>stand in standing frame x 3. Pt able to tolerate 5 min +6 min x 2 in standing with only mild desat to 88% which improved to 94% with focused pursed lip breathing. Pt performed knee extension in standing sling x 2 and x 3 with UE support on standing frame table. Pt required prolonged rest breaks between all bouts of standing and transfers due to mild SOB and fatigue as well as increased HA due to sinus pressure. Pt  performed SB transfer to bed with set up assist and cues fore WC parts management. Pt donned shoes without assist sitting EOB. Sit>supine completed without assist, and left supine in bed with call bell in reach and all needs met. Throughout session pt remained on 5L/min supplemental O2.         Therapy Documentation Precautions:  Precautions Precautions: Fall, Other (comment) Precaution Comments: 5-7L via  Restrictions Weight Bearing Restrictions: No General: PT Amount of Missed Time (min): 15 Minutes and 45 min  PT Missed Treatment Reason:  Patient fatigue;Patient ill (Comment) (n/v) Vital Signs: Therapy Vitals Temp: 98.1 F (36.7 C) Pulse Rate: 93 Resp: 15 BP: 109/80 Patient Position (if appropriate): Sitting Oxygen Therapy SpO2: 98 % O2 Device: Nasal Cannula Pain: Pain Assessment Pain Scale: 0-10 Pain Score: 7  Pain Type: Acute pain Pain Location: Leg Pain Orientation: Lower Pain Descriptors / Indicators: Aching Pain Frequency: Constant Pain Onset:  On-going Patients Stated Pain Goal: 4 Pain Intervention(s): Medication (See eMAR)   Therapy/Group: Individual Therapy  Lorie Phenix 12/20/2019, 8:04 AM

## 2019-12-20 NOTE — Progress Notes (Signed)
Patient ID: Bonnie Ballard, female   DOB: Aug 31, 1965, 54 y.o.   MRN: [id number]   Patient DME ordered through [employer]:  Wheelchair, Home 02 Set Up, Drop Arm Bed Side Commode and Tub Transfer Bench  *ordered patient a 20x18, if OOS will attempt 18x18

## 2019-12-21 ENCOUNTER — Inpatient Hospital Stay (HOSPITAL_COMMUNITY): Payer: Medicare Other | Admitting: *Deleted

## 2019-12-21 ENCOUNTER — Encounter (HOSPITAL_COMMUNITY): Payer: Self-pay | Admitting: Physical Medicine & Rehabilitation

## 2019-12-21 ENCOUNTER — Inpatient Hospital Stay (HOSPITAL_COMMUNITY): Payer: Medicare Other | Admitting: Physical Therapy

## 2019-12-21 ENCOUNTER — Inpatient Hospital Stay (HOSPITAL_COMMUNITY): Payer: Medicare Other | Admitting: Occupational Therapy

## 2019-12-21 MED ORDER — AMOXICILLIN-POT CLAVULANATE 875-125 MG PO TABS
1.0000 | ORAL_TABLET | Freq: Two times a day (BID) | ORAL | Status: AC
  Administered 2019-12-21 – 2019-12-26 (×11): 1 via ORAL
  Filled 2019-12-21 (×11): qty 1

## 2019-12-21 NOTE — Progress Notes (Addendum)
Patient ID: Bonnie Ballard, female   DOB: 05/11/1965, 54 y.o.   MRN: [id number]   Patient referral sent to [facility], unable to accept patient for therapy follow up!

## 2019-12-21 NOTE — Progress Notes (Signed)
Recreational Therapy Session Note  Patient Details  Name: Bonnie Ballard MRN: [id number] Date of Birth: Jul 23, 1965 Today's Date: 12/21/2019  Pain: no c/o Skilled Therapeutic Interventions/Progress Updates: Session focused on discharge planning as pt is anxious to return home but concerned about home set up, bathroom accessibility, traveling 1.5 hours home by car ride and how to use and manage oxygen in the car and at home.  Addressed concerns and have shared concerns with therapy team to further address.  Therapy/Group: Individual TherapySIMPSON,Arshad Oberholzer 12/21/2019, 12:20 PM

## 2019-12-21 NOTE — Progress Notes (Signed)
Ok to change Augmentin to 7d of therapy per Dr. Wynn Banker.  Ulyses Southward, PharmD, BCIDP, AAHIVP, CPP Infectious Disease Pharmacist 12/21/2019 3:02 PM

## 2019-12-21 NOTE — Progress Notes (Signed)
PHYSICAL MEDICINE & REHABILITATION PROGRESS NOTE   Subjective/Complaints:  No issues overnite,  Still some pain under eyes, discussed CT result, pt gives hx of sinus surgery many years ago   ROS: Patient denies CP, SOB, N/V/D  Objective:   CT MAXILLOFACIAL WO CONTRAST  Result Date: 12/20/2019 CLINICAL DATA:  Maxillofacial pain EXAM: CT MAXILLOFACIAL WITHOUT CONTRAST TECHNIQUE: Multidetector CT imaging of the maxillofacial structures was performed. Multiplanar CT image reconstructions were also generated. COMPARISON:  None. FINDINGS: Paranasal sinuses: Frontal: Normally aerated. Patent frontal sinus drainage pathways. Ethmoid: Mild ethmoid sinus mucosal thickening. Maxillary: Mild bilateral maxillary sinus mucosal thickening with layering small volume secretions. Sequela of bilateral antrostomies. Bilateral ostia are patent. Sphenoid: Minimal mucosal thickening. Patent sphenoethmoidal recesses. Nasal passages: Patent. Intact nasal septum is midline. Indwelling nasal cannula. Anatomy: No pneumatization superior to anterior ethmoid notches. Symmetric and intact olfactory grooves and fovea ethmoidalis, Keros II (4-41mm) Sellar sphenoid pneumatization pattern. No dehiscence of carotid or optic canals. No onodi cell. Other: Orbits and intracranial compartment are unremarkable. Visible mastoid air cells are normally aerated. Small left petrous apex effusion. IMPRESSION: Mild ethmoid, sphenoid and maxillary sinus disease. Clear sinonasal drainage pathways. Layering maxillary sinus secretions may reflect active inflammation. Sequela of prior maxillary antrostomies. Electronically Signed   By: Stana Bunting M.D.   On: 12/20/2019 12:14   Recent Labs    12/19/19 1119  WBC 15.3*  HGB 12.2  HCT 38.5  PLT 240   Recent Labs    12/19/19 1119  NA 141  K 2.8*  CL 100  CO2 29  GLUCOSE 166*  BUN 15  CREATININE 0.72  CALCIUM 9.0    Intake/Output Summary (Last 24 hours) at 12/21/2019  0803 Last data filed at 12/20/2019 1700 Gross per 24 hour  Intake 360 ml  Output --  Net 360 ml     Physical Exam: Vital Signs Blood pressure 121/84, pulse 81, temperature 97.8 F (36.6 C), resp. rate 18, height 5\' 7"  (1.702 m), weight 103.4 kg, SpO2 95 %.    General: No acute distress Mood and affect are appropriate Heart: Regular rate and rhythm no rubs murmurs or extra sounds Lungs: Clear to auscultation, breathing unlabored, no rales or wheezes Abdomen: Positive bowel sounds, soft nontender to palpation, nondistended Extremities: No clubbing, cyanosis, or edema Skin: No evidence of breakdown, no evidence of rash  HEENT frontal and maxillary sinus tenderness to palpation.  No nasal discharge  Neurologic: Cranial nerves II through XII intact, motor strength is 5/5 in bilateral deltoid, bicep, tricep, grip, hip flexor, knee extensors, ankle dorsiflexor and plantar flexor 3/5 Hip ext  Musculoskeletal: Full range of motion in all 4 extremities. No joint swelling  Assessment/Plan: 1. Functional deficits secondary to CIM which require 3+ hours per day of interdisciplinary therapy in a comprehensive inpatient rehab setting.  Physiatrist is providing close team supervision and 24 hour management of active medical problems listed below.  Physiatrist and rehab team continue to assess barriers to discharge/monitor patient progress toward functional and medical goals  Care Tool:  Bathing    Body parts bathed by patient: Front perineal area, Face, Right arm, Left arm, Chest, Abdomen, Right upper leg, Left upper leg, Right lower leg, Left lower leg, Buttocks   Body parts bathed by helper: Front perineal area, Buttocks, Right upper leg, Left upper leg, Right lower leg, Left lower leg Body parts n/a: Right arm, Left arm, Chest, Abdomen   Bathing assist Assist Level: Supervision/Verbal cueing (UB EOB and LB bedlevel)  Upper Body Dressing/Undressing Upper body dressing   What is  the patient wearing?: Pull over shirt    Upper body assist Assist Level: Supervision/Verbal cueing    Lower Body Dressing/Undressing Lower body dressing      What is the patient wearing?: Pants     Lower body assist Assist for lower body dressing: Moderate Assistance - Patient 50 - 74%     Toileting Toileting    Toileting assist Assist for toileting: Moderate Assistance - Patient 50 - 74%     Transfers Chair/bed transfer  Transfers assist     Chair/bed transfer assist level: Supervision/Verbal cueing (Slideboard)     Locomotion Ambulation   Ambulation assist   Ambulation activity did not occur: Safety/medical concerns          Walk 10 feet activity   Assist  Walk 10 feet activity did not occur: Safety/medical concerns        Walk 50 feet activity   Assist Walk 50 feet with 2 turns activity did not occur: Safety/medical concerns         Walk 150 feet activity   Assist Walk 150 feet activity did not occur: Safety/medical concerns         Walk 10 feet on uneven surface  activity   Assist Walk 10 feet on uneven surfaces activity did not occur: Safety/medical concerns         Wheelchair     Assist Will patient use wheelchair at discharge?: Yes Type of Wheelchair: Manual    Wheelchair assist level: Supervision/Verbal cueing Max wheelchair distance: 100    Wheelchair 50 feet with 2 turns activity    Assist        Assist Level: Supervision/Verbal cueing   Wheelchair 150 feet activity     Assist      Assist Level: Supervision/Verbal cueing (mult brief rest breaks)   Blood pressure 121/84, pulse 81, temperature 97.8 F (36.6 C), resp. rate 18, height 5\' 7"  (1.702 m), weight 103.4 kg, SpO2 95 %.  Medical Problem List and Plan: 1.  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  Continue prednisone with taper Currently 20mg  daily - add flutter valve  -Currently with 5 L oxygen nasal cannula              -patient may  shower             -ELOS/Goals:  Mod I to supervision 9/10    2.  Antithrombotics: -DVT/anticoagulation: Bilateral lower extremity DVTs.  Patient will remain on Eliquis 5 mg twice daily for a minimum of 3 months total             -antiplatelet therapy: N/A 3. Pain Management: Hydrocodone 1 tablet every 6 hours as needed- pt said she took Percocet 10/325 mg QID at home- Added heat , Kpad. Well controlled.  4. Mood: Clonazepam 0.5 mg 3 times daily, Paxil 40 mg daily, melatonin 6 mg nightly- will see if possible to wean  -would benefit from neuropsych referral to help deal with situational anxiety             -antipsychotic agents: N/A  5. Neuropsych: This patient is capable of making decisions on her own behalf. 6. Skin/Wound Care: Routine skin checks 7. Fluids/Electrolytes/Nutrition: Routine in and outs with follow-up chemistries. Added Boost BID as per patient's request 8.  Hypothyroidism.  Continue Synthroid 25 mcg Monday Wednesday Friday 9.  Hypertension.  Lasix 40 mg daily, metoprolol 50 mg twice daily Vitals:  12/20/19 2006 12/21/19 0500  BP: 104/64 121/84  Pulse: (!) 102 81  Resp: 18 18  Temp: 97.9 F (36.6 C) 97.8 F (36.6 C)  SpO2: 95%   controlled  10.  Hyperlipidemia.  Lipitor 40 mg daily 11.  History of gastric bypass 2017.  Dietary follow-up 12.  History left TKA April 2021.  Weightbearing as tolerated- left knee actually hurts more than RIght  And needs TKR on R knee, per pt. 13. Grounds pass if portable O2 is available  14.  Post infectious cough on Dulera, on Prednisone taper. Advised tea with honey, add flutter valve 15.  Acute vs chronic sinusitis with max sinus tenderness maxillofacial CT of sinuses demonstrates some fluid in maxillary sinuses as well as post op changes, start Augmentin, Afrin spray LOS: 17 days A FACE TO FACE EVALUATION WAS PERFORMED  Erick Colace 12/21/2019, 8:03 AM

## 2019-12-21 NOTE — Progress Notes (Signed)
Occupational Therapy Session Note  Patient Details  Name: Bonnie Ballard MRN: [id number] Date of Birth: 01/14/1966  Today's Date: 12/21/2019 OT Individual Time: 1540-0867 OT Individual Time Calculation (min): 60 min   Skilled Therapeutic Interventions/Progress Updates:    Pt greeted in bed, satting at 91% on 5L, still not feeling well due to sinus infection with reports of increased pressure in head when sitting up. She c/o 5-6/10 pain in her LEs, RN in at start of session to provide pain medicine. Pt then requested to engage in bathing/dressing tasks. Supine<sit completed without assistance with HOB elevated. She completed UB self care while sitting EOB and then requested to return to semi reclined position in bed due to SOB/increased head pressure from sinus infection. 02 sats dropped into the 83-85% range and it took ~2 minutes for 02 sats to reach 90-91% again. The remainder of the ADL was completed bedlevel using semi-reclined and supine positions. Pt able to elevate each LE into reclined figure 4 position to wash LB and don ankle socks. Rolling utilized for completing perhygiene with setup and perihygiene for a 2nd time after using the bedpan with +bladder void. Supervision for rolling towards the Lt side. Pt left in care of NT to resume with donning pants. She was satting at 92% before OT departure.   Therapy Documentation Precautions:  Precautions Precautions: Fall, Other (comment) Precaution Comments: 5-7L via [place] Restrictions Weight Bearing Restrictions: No ADL: ADL Eating: Independent Where Assessed-Eating: Bed level Grooming: Setup Where Assessed-Grooming: Edge of bed Upper Body Bathing: Minimal assistance Where Assessed-Upper Body Bathing: Bed level Lower Body Bathing: Moderate assistance Where Assessed-Lower Body Bathing: Bed level Upper Body Dressing: Minimal assistance Where Assessed-Upper Body Dressing: Bed level Lower Body Dressing: Maximal assistance Where Assessed-Lower  Body Dressing: Bed level Toileting: Maximal assistance Where Assessed-Toileting: Bed level Toilet Transfer: Maximal assistance Toilet Transfer Method: Other (comment) Antony Salmon) Toilet Transfer Equipment: Animator Transfer: Unable to assess     Therapy/Group: Individual Therapy  Tamica Covell A Aniken Monestime 12/21/2019, 12:35 PM

## 2019-12-21 NOTE — Progress Notes (Signed)
Physical Therapy Session Note  Patient Details  Name: Bonnie Ballard MRN: [id number] Date of Birth: 1965/05/02  Today's Date: 12/21/2019 PT Individual Time: 0950-1050 and 1405-1520 PT Individual Time Calculation (min): 60 min and 75 min    Short Term Goals: Week 2:  PT Short Term Goal 1 (Week 2): Pt will transfer to and from [facility] with supervision assist with SB PT Short Term Goal 1 - Progress (Week 2): Met PT Short Term Goal 2 (Week 2): Pt will perform sit<>stand with mod assist using RW PT Short Term Goal 2 - Progress (Week 2): Progressing toward goal PT Short Term Goal 3 (Week 2): Pt will ambulate 31f with mod assist in parallel bars PT Short Term Goal 3 - Progress (Week 2): Progressing toward goal Week 3:  PT Short Term Goal 1 (Week 3): STG=LTG due to ELOS  Skilled Therapeutic Interventions/Progress Updates:  Session 1   Pt received sitting in WC and agreeable to PT. [facility] Sit<>stand in parallel bars x 1 with mod assist from PT.   Gait training in parallel bars with mod assist to block stance leg and +2 assist for WC follow x 10 ft. One instances of knee instability on the LLE on the final step with the RLE.   SB transfer to and from mat table with set up assist from PT. Pt abe to position WC properly to perform transfer without cues from PT.    Pt performed sit<>stand from mat table with RW and mod-max assist from 22inches and 23 inches x 3. Cues for proper anterior weight shift and increased terminal knee extension in stance as tolerated.  Pt tolerated standing with UE support on RW x 15 sec for each bout with noted improvement in breathing pattern with increased repetitions.   WC mobility through [place] x 1538fto room with supervision assist [facility] as pt preparing for transfer to E[facility]. SB transfer to bed with set up assist of SB. Pt then performed sit>supine without assist from PT or use of UE support from rails. Left supine in bed with  call bell in reach and all needs met.   Throughout session pt maintained on 5-6 L/min supplemental O2 with minor desat to 93% following activity but increased to 99-100% in 30-45 seconds of rest.   Session 2.   Pt received supine in bed and agreeable to PT. Supine>sit transfer without assist or cues. [facility] and bladder; peri care and clothing management performed without assist from PTand increased time for safety. [facility] due to mild instability of SB on slick BSC surface.   WC mobility through [place] with supervision assist and cues for obstacle navigation in tight spaces x [id number]   UE cardiovascular endurance training with wii sports tennis and boxing and power bolwing, 3 game of tennis, 10 rounds of bowleing and 3 rounds of boxing. Pt required  Multiple prolonged rest breaks throughout session, SpO2 remained >90% on 6L/min with activity.   Pt returned to room and performed SB transfer to bed with set up assist. Pt doffed shoes sitting EOB with out assist. Sit>supine completed without assist or cues, and left supine in bed with call bell in reach and all needs met.        Therapy Documentation Precautions:  Precautions Precautions: Fall, Other (comment) Precaution Comments: 5-7L via [place] Restrictions Weight Bearing Restrictions: No    Vital  Signs:   see above Pain: Pain Assessment Pain Scale: 0-10 Pain Score: 6  Pain Type: Acute pain Pain Location: Knee Pain Orientation: Right Pain Descriptors / Indicators: Aching Pain Frequency: Constant Pain Onset: On-going Patients Stated Pain Goal: 2 Pain Intervention(s): Medication (See eMAR)    Therapy/Group: Individual Therapy  Lorie Phenix 12/21/2019, 11:46 AM

## 2019-12-22 MED ORDER — PSEUDOEPHEDRINE HCL 30 MG PO TABS
60.0000 mg | ORAL_TABLET | Freq: Four times a day (QID) | ORAL | Status: DC | PRN
  Administered 2019-12-22 – 2019-12-27 (×6): 60 mg via ORAL
  Filled 2019-12-22 (×9): qty 2
  Filled 2019-12-22: qty 1
  Filled 2019-12-22: qty 2

## 2019-12-22 NOTE — Progress Notes (Signed)
[place] PHYSICAL MEDICINE & REHABILITATION PROGRESS NOTE   Pt reports her sinus pain is still "awful"- on ABX for sinus infection- still on 5L O2- LBM yesterday.    ROS: CTA B/L- no W/R/R- good air movement  Objective:   No results found. No results for input(s): WBC, HGB, HCT, PLT in the last 72 hours. No results for input(s): NA, K, CL, CO2, GLUCOSE, BUN, CREATININE, CALCIUM in the last 72 hours.  Intake/Output Summary (Last 24 hours) at 12/22/2019 1520 Last data filed at 12/22/2019 1317 Gross per 24 hour  Intake 684 ml  Output --  Net 684 ml     Physical Exam: Vital Signs Blood pressure 100/73, pulse 88, temperature 97.6 F (36.4 C), resp. rate 16, height 5\' 7"  (1.702 m), weight 103.4 kg, SpO2 96 %.    General: No acute distress- pt sitting up in bed- wearing 5.5L O2 (changed to 5L)- NAD HENT: Very TTP over frontal and maxillary sinuses B/L Mood and affect are appropriate Heart: RRR Lungs: CTA B/L- no W/R/R- good air movement- on 5L O2 by [place] Abdomen: Soft, NT, ND, (+)BS  Extremities: No clubbing, cyanosis, or edema Skin: No evidence of breakdown, no evidence of rash   Neurologic: Cranial nerves II through XII intact, motor strength is 5/5 in bilateral deltoid, bicep, tricep, grip, hip flexor, knee extensors, ankle dorsiflexor and plantar flexor 3/5 Hip ext  Musculoskeletal: Full range of motion in all 4 extremities. No joint swelling  Assessment/Plan: 1. Functional deficits secondary to CIM which require 3+ hours per day of interdisciplinary therapy in a comprehensive inpatient rehab setting.  Physiatrist is providing close team supervision and 24 hour management of active medical problems listed below.  Physiatrist and rehab team continue to assess barriers to discharge/monitor patient progress toward functional and medical goals  Care Tool:  Bathing    Body parts bathed by patient: Front perineal area, Face, Right arm, Left arm, Chest, Abdomen, Right upper  leg, Left upper leg, Right lower leg, Left lower leg, Buttocks   Body parts bathed by helper: Front perineal area, Buttocks, Right upper leg, Left upper leg, Right lower leg, Left lower leg Body parts n/a: Right arm, Left arm, Chest, Abdomen   Bathing assist Assist Level: Supervision/Verbal cueing     Upper Body Dressing/Undressing Upper body dressing   What is the patient wearing?: Pull over shirt    Upper body assist Assist Level: Set up assist    Lower Body Dressing/Undressing Lower body dressing      What is the patient wearing?: Pants     Lower body assist Assist for lower body dressing: Moderate Assistance - Patient 50 - 74%     Toileting Toileting    Toileting assist Assist for toileting: Moderate Assistance - Patient 50 - 74%     Transfers Chair/bed transfer  Transfers assist     Chair/bed transfer assist level: Set up assist (SB)     Locomotion Ambulation   Ambulation assist   Ambulation activity did not occur: Safety/medical concerns  Assist level: 2 helpers Assistive device: Parallel bars Max distance: 31ft   Walk 10 feet activity   Assist  Walk 10 feet activity did not occur: Safety/medical concerns  Assist level: 2 helpers Assistive device: Parallel bars   Walk 50 feet activity   Assist Walk 50 feet with 2 turns activity did not occur: Safety/medical concerns         Walk 150 feet activity   Assist Walk 150 feet activity did  not occur: Safety/medical concerns         Walk 10 feet on uneven surface  activity   Assist Walk 10 feet on uneven surfaces activity did not occur: Safety/medical concerns         Wheelchair     Assist Will patient use wheelchair at discharge?: Yes Type of Wheelchair: Manual    Wheelchair assist level: Supervision/Verbal cueing Max wheelchair distance: 150    Wheelchair 50 feet with 2 turns activity    Assist        Assist Level: Supervision/Verbal cueing   Wheelchair  150 feet activity     Assist      Assist Level: Supervision/Verbal cueing   Blood pressure 100/73, pulse 88, temperature 97.6 F (36.4 C), resp. rate 16, height 5\' 7"  (1.702 m), weight 103.4 kg, SpO2 96 %.  Medical Problem List and Plan: 1.  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  Continue prednisone with taper Currently 20mg  daily - add flutter valve  -Currently with 5 L oxygen nasal cannula             -patient may  shower             -ELOS/Goals:  Mod I to supervision 9/10  2.  Antithrombotics: -DVT/anticoagulation: Bilateral lower extremity DVTs.  Patient will remain on Eliquis 5 mg twice daily for a minimum of 3 months total             -antiplatelet therapy: N/A 3. Pain Management: Hydrocodone 1 tablet every 6 hours as needed- pt said she took Percocet 10/325 mg QID at home- Added heat , Kpad. Well controlled.   9/4- pain well controlled except for sinus pressure- con't regimen 4. Mood: Clonazepam 0.5 mg 3 times daily, Paxil 40 mg daily, melatonin 6 mg nightly- will see if possible to wean  -would benefit from neuropsych referral to help deal with situational anxiety             -antipsychotic agents: N/A  5. Neuropsych: This patient is capable of making decisions on her own behalf. 6. Skin/Wound Care: Routine skin checks 7. Fluids/Electrolytes/Nutrition: Routine in and outs with follow-up chemistries. Added Boost BID as per patient's request 8.  Hypothyroidism.  Continue Synthroid 25 mcg Monday Wednesday Friday 9.  Hypertension.  Lasix 40 mg daily, metoprolol 50 mg twice daily Vitals:   12/22/19 0837 12/22/19 1458  BP: 108/71 100/73  Pulse: 84 88  Resp:  16  Temp:  97.6 F (36.4 C)  SpO2:  96%  9/4- BP well controlled- con't regimen 10.  Hyperlipidemia.  Lipitor 40 mg daily 11.  History of gastric bypass 2017.  Dietary follow-up 12.  History left TKA April 2021.  Weightbearing as tolerated- left knee actually hurts more than RIght  And  needs TKR on R knee, per pt. 13. Grounds pass if portable O2 is available  14.  Post infectious cough on Dulera, on Prednisone taper. Advised tea with honey, add flutter valve 15.  Acute vs chronic sinusitis with max sinus tenderness maxillofacial CT of sinuses demonstrates some fluid in maxillary sinuses as well as post op changes, start Augmentin, Afrin spray  9/4- will add sudafed 60 mg q6 hours prn for sinus pressure   LOS: 18 days A FACE TO FACE EVALUATION WAS PERFORMED  Areeb Corron 12/22/2019, 3:20 PM

## 2019-12-23 ENCOUNTER — Inpatient Hospital Stay (HOSPITAL_COMMUNITY): Payer: Medicare Other

## 2019-12-23 MED ORDER — ALBUTEROL SULFATE (2.5 MG/3ML) 0.083% IN NEBU
3.0000 mL | INHALATION_SOLUTION | RESPIRATORY_TRACT | Status: DC | PRN
  Administered 2019-12-23 – 2019-12-25 (×4): 3 mL via RESPIRATORY_TRACT
  Filled 2019-12-23 (×6): qty 3

## 2019-12-23 MED ORDER — BENZONATATE 100 MG PO CAPS
200.0000 mg | ORAL_CAPSULE | Freq: Three times a day (TID) | ORAL | Status: DC | PRN
  Administered 2019-12-23 – 2019-12-27 (×11): 200 mg via ORAL
  Filled 2019-12-23 (×13): qty 2

## 2019-12-23 NOTE — Progress Notes (Signed)
Physical Therapy Session Note  Patient Details  Name: Maraya Gwilliam MRN: [id number] Date of Birth: 09-Mar-1966  Today's Date: 12/23/2019 PT Individual Time: 1445-1530 PT Individual Time Calculation (min): 45 min   Short Term Goals: Week 3:  PT Short Term Goal 1 (Week 3): STG=LTG due to ELOS  Skilled Therapeutic Interventions/Progress Updates:     Patient in w/c in the room on 5L/min via nasal canula upon PT arrival. Patient alert and agreeable to PT session. Patient denied pain during session, however, reports persistent cough today and requesting medication, also HR elevated 117-122 at rest, RN made aware and called respiratory for breathing treatment. Focused session on breathing exercises and breath control, as patient was very SOB following bouts of coughing and notably anxious about SOB.   Therapeutic Activity: Bed Mobility: Patient performed sit to supine with supervision with HOB elevated.  Transfers: Patient performed a slide board transfer w/c>bed with supervision and min A for board placement. Provided cues for hand placement, board placement, w/c set-up, and head-hips relationship for proper technique and decreased assist with transfers.   Therapeutic Exercise: Patient performed the following exercises in sitting with verbal and tactile cues for proper technique. -diaphragmatic breathing with manual resistance 3x10, progressed from PT provided resistance to patient applying resistance over diaphragm  -incentive spirometer 2x10 focused on 2-3 sec hold -flutter valve x10 focused on slow controlled breath  -hands on thighs with trunk flexion/extension following exhalation/inhalation respectively 2x10 Focused on sitting posture without back support throughout exercises and educated on purpose of each exercise for improved breath support and control and transfer to functional mobility. Also educated on purpose of flutter valve and incentive spirometer. Instructed to perform all exercises  every hour while in sitting or with HOB elevated to strengthen respiratory muscles and improve carry-over to functional mobility. Patient very appreciative for all education.   Patient required increased time with all activities due to bouts of coughing and recovery from coughing throughout session.   Patient in bed on 5L/min O2 at end of session with breaks locked, bed alarm set, and all needs within reach. SPO2 >94% throughout.    Therapy Documentation Precautions:  Precautions Precautions: Fall, Other (comment) Precaution Comments: 5-7L via [place] Restrictions Weight Bearing Restrictions: No   Therapy/Group: Individual Therapy  Krissy Orebaugh L Sherrika Weakland PT, DPT  12/23/2019, 4:44 PM

## 2019-12-23 NOTE — Progress Notes (Signed)
[place] PHYSICAL MEDICINE & REHABILITATION PROGRESS NOTE   Pt reports sudafed helped pain of sinuses- but hasn't taken yet today.   Also having a bad cough that has come back- feels a little wheezy.    ROS:  Pt denies SOB but [place] wheezy per pt, abd pain, CP, N/V/C/D, and vision changes   Objective:   No results found. No results for input(s): WBC, HGB, HCT, PLT in the last 72 hours. No results for input(s): NA, K, CL, CO2, GLUCOSE, BUN, CREATININE, CALCIUM in the last 72 hours.  Intake/Output Summary (Last 24 hours) at 12/23/2019 1522 Last data filed at 12/23/2019 1412 Gross per 24 hour  Intake 984 ml  Output --  Net 984 ml     Physical Exam: Vital Signs Blood pressure 105/76, pulse 98, temperature 97.8 F (36.6 C), resp. rate 16, height 5\' 7"  (1.702 m), weight 103.4 kg, SpO2 100 %.    General: Pt sitting up in bed- appropriate, wearing O2 by [place], 5L, NAD HENT: Very TTP over frontal and maxillary sinuses B/L- slightly less TTP Mood and affect are appropriate Heart: borderline tachycardia- regular rhythm Lungs: a little wheezy- good air movement, but deep breath caused her to have hacking cough- 5L O2 by  Abdomen: Soft, NT, ND, (+)BS  Extremities: No clubbing, cyanosis, or edema Skin: No evidence of breakdown, no evidence of rash   Neurologic: Cranial nerves II through XII intact, motor strength is 5/5 in bilateral deltoid, bicep, tricep, grip, hip flexor, knee extensors, ankle dorsiflexor and plantar flexor 3/5 Hip ext  Musculoskeletal: Full range of motion in all 4 extremities. No joint swelling  Assessment/Plan: 1. Functional deficits secondary to CIM which require 3+ hours per day of interdisciplinary therapy in a comprehensive inpatient rehab setting.  Physiatrist is providing close team supervision and 24 hour management of active medical problems listed below.  Physiatrist and rehab team continue to assess barriers to discharge/monitor patient progress toward  functional and medical goals  Care Tool:  Bathing    Body parts bathed by patient: Front perineal area, Face, Right arm, Left arm, Chest, Abdomen, Right upper leg, Left upper leg, Right lower leg, Left lower leg, Buttocks   Body parts bathed by helper: Front perineal area, Buttocks, Right upper leg, Left upper leg, Right lower leg, Left lower leg Body parts n/a: Right arm, Left arm, Chest, Abdomen   Bathing assist Assist Level: Supervision/Verbal cueing     Upper Body Dressing/Undressing Upper body dressing   What is the patient wearing?: Pull over shirt    Upper body assist Assist Level: Set up assist    Lower Body Dressing/Undressing Lower body dressing      What is the patient wearing?: Pants     Lower body assist Assist for lower body dressing: Moderate Assistance - Patient 50 - 74%     Toileting Toileting    Toileting assist Assist for toileting: Moderate Assistance - Patient 50 - 74%     Transfers Chair/bed transfer  Transfers assist     Chair/bed transfer assist level: Set up assist (SB)     Locomotion Ambulation   Ambulation assist   Ambulation activity did not occur: Safety/medical concerns  Assist level: 2 helpers Assistive device: Parallel bars Max distance: 36ft   Walk 10 feet activity   Assist  Walk 10 feet activity did not occur: Safety/medical concerns  Assist level: 2 helpers Assistive device: Parallel bars   Walk 50 feet activity   Assist Walk 50 feet with  2 turns activity did not occur: Safety/medical concerns         Walk 150 feet activity   Assist Walk 150 feet activity did not occur: Safety/medical concerns         Walk 10 feet on uneven surface  activity   Assist Walk 10 feet on uneven surfaces activity did not occur: Safety/medical concerns         Wheelchair     Assist Will patient use wheelchair at discharge?: Yes Type of Wheelchair: Manual    Wheelchair assist level: Supervision/Verbal  cueing Max wheelchair distance: 150    Wheelchair 50 feet with 2 turns activity    Assist        Assist Level: Supervision/Verbal cueing   Wheelchair 150 feet activity     Assist      Assist Level: Supervision/Verbal cueing, Minimal Assistance - Patient > 75%   Blood pressure 105/76, pulse 98, temperature 97.8 F (36.6 C), resp. rate 16, height 5\' 7"  (1.702 m), weight 103.4 kg, SpO2 100 %.  Medical Problem List and Plan: 1.  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  Continue prednisone with taper Currently 20mg  daily - add flutter valve  9/5- will add Albuterol inhlaer 1-2 puffs q4 hours prn for a little wheezing/cough  -Currently with 5 L oxygen nasal cannula             -patient may  shower             -ELOS/Goals:  Mod I to supervision 9/10  2.  Antithrombotics: -DVT/anticoagulation: Bilateral lower extremity DVTs.  Patient will remain on Eliquis 5 mg twice daily for a minimum of 3 months total             -antiplatelet therapy: N/A 3. Pain Management: Hydrocodone 1 tablet every 6 hours as needed- pt said she took Percocet 10/325 mg QID at home- Added heat , Kpad. Well controlled.   9/4- pain well controlled except for sinus pressure- con't regimen 4. Mood: Clonazepam 0.5 mg 3 times daily, Paxil 40 mg daily, melatonin 6 mg nightly- will see if possible to wean  -would benefit from neuropsych referral to help deal with situational anxiety             -antipsychotic agents: N/A  5. Neuropsych: This patient is capable of making decisions on her own behalf. 6. Skin/Wound Care: Routine skin checks 7. Fluids/Electrolytes/Nutrition: Routine in and outs with follow-up chemistries. Added Boost BID as per patient's request 8.  Hypothyroidism.  Continue Synthroid 25 mcg Monday Wednesday Friday 9.  Hypertension.  Lasix 40 mg daily, metoprolol 50 mg twice daily Vitals:   12/23/19 1430 12/23/19 1514  BP: 105/76   Pulse: 98   Resp: 16   Temp: 97.8 F  (36.6 C)   SpO2: 99% 100%  9/5- BP controlled- con't regimen 10.  Hyperlipidemia.  Lipitor 40 mg daily 11.  History of gastric bypass 2017.  Dietary follow-up 12.  History left TKA April 2021.  Weightbearing as tolerated- left knee actually hurts more than RIght  And needs TKR on R knee, per pt. 13. Grounds pass if portable O2 is available  14.  Post infectious cough on Dulera, on Prednisone taper. Advised tea with honey, add flutter valve  9/5- wheezing a little- added albuterol inhaler prn- might need nebs? Also added tesselon pearls as needed 15.  Acute vs chronic sinusitis with max sinus tenderness maxillofacial CT of sinuses demonstrates some fluid in maxillary sinuses  as well as post op changes, start Augmentin, Afrin spray  9/4- will add sudafed 60 mg q6 hours prn for sinus pressure  9/5- a little better   LOS: 19 days A FACE TO FACE EVALUATION WAS PERFORMED  Avacyn Kloosterman 12/23/2019, 3:22 PM

## 2019-12-23 NOTE — Progress Notes (Addendum)
Physical Therapy Session Note  Patient Details  Name: Bonnie Ballard MRN: [id number] Date of Birth: 11-06-1965  Today's Date: 12/23/2019 PT Individual Time: 1140-1208; 1348- 1415 PT Individual Time Calculation (min): 28 min , 27 min  Short Term Goals:  Week 3:  PT Short Term Goal 1 (Week 3): STG=LTG due to ELOS     Skilled Therapeutic Interventions/Progress Updates:  tx 1:  Pt seated in w/c, on 4.5 L O2/min through wall.  She stated HA pain due to sinus infection = 4/10, premedicated.  She stated that she had not had Sudafed this morning, and felt that she needed.  PT informed RN.  On 4L O2 via bottle, [place], O2 = 100% before start of activity.  Reciprocal scooting forward/backward in w/c with supervision. Attempted sit> stand in parallel bars, but pt unable to bear wt through LEs, and she declined trying again.  Strengthening exs in sitting in w/c: 10 x 1 each-- R/L long arc quad knee extensions with 2# ankle weight, 5 x 2 bil adductor squeezes with glut sets.  O2 sats dropped to 90% but quickly increased to 95%. Pt coughed frequently with activity; she stated that she had had a breathing tx this AM which helped.  W/c propulsion x 150' with supervision.  At end of session, pt on 4.0L through wall via [place].  Needs left at hand.   tx 2;  Pt on 4.5 L /min O2 via wall, [place].  Pt seated in w/c, requesting to use BSC.  She stated that HA "still there" but unrated.  Pt requested that O2 be increased for BSC transfer; PT increased to 6L/min.  Slide board transfer with placement of board fully in place, and removal by PT.  No assistance needed for pt to move across board.  Pt doffed leggings by wt shifting side to side in sitting gon BSC, stand by assistance.  Pt continent of B and B.  Pt performed peri care with set up.  She pulled up leggings most of the way, with 2 rest breaks.  PT completed pulling them up.  Slide board transfer to move back into w/c with set up of board as above.  Pt quite SOB; O2  sats 88%, cues for slower breathing with good results and sats increasing to >90%.  .  Pt coughed repeatedly after transfer and stated that this now happens with exertion unless she has a cough drop.    At end of session, pt seated in w/c with needs at hand.      Therapy Documentation Precautions:  Precautions Precautions: Fall, Other (comment) Precaution Comments: 4-7L via [place] Restrictions Weight Bearing Restrictions: No         Therapy/Group: Individual Therapy  Keairra Bardon 12/23/2019, 12:11 PM

## 2019-12-24 ENCOUNTER — Inpatient Hospital Stay (HOSPITAL_COMMUNITY): Payer: Medicare Other | Admitting: Physical Therapy

## 2019-12-24 ENCOUNTER — Inpatient Hospital Stay (HOSPITAL_COMMUNITY): Payer: Medicare Other | Admitting: Occupational Therapy

## 2019-12-24 ENCOUNTER — Inpatient Hospital Stay (HOSPITAL_COMMUNITY): Payer: Medicare Other

## 2019-12-24 MED ORDER — PREDNISONE 5 MG PO TABS
15.0000 mg | ORAL_TABLET | Freq: Every day | ORAL | Status: DC
  Administered 2019-12-25 – 2019-12-28 (×4): 15 mg via ORAL
  Filled 2019-12-24 (×4): qty 3

## 2019-12-24 MED ORDER — MENTHOL 3 MG MT LOZG
1.0000 | LOZENGE | OROMUCOSAL | Status: DC | PRN
  Administered 2019-12-26 – 2019-12-27 (×3): 3 mg via ORAL
  Filled 2019-12-24 (×3): qty 9

## 2019-12-24 NOTE — Progress Notes (Signed)
[facility] PHYSICAL MEDICINE & REHABILITATION PROGRESS NOTE    More coughing over the last couple days , has needed albuterol, doing ok with PT, no fevers  ROS:    ROS- neg CP, SOB, N/V/D    Objective:   No results found. No results for input(s): WBC, HGB, HCT, PLT in the last 72 hours. No results for input(s): NA, K, CL, CO2, GLUCOSE, BUN, CREATININE, CALCIUM in the last 72 hours.  Intake/Output Summary (Last 24 hours) at 12/24/2019 1002 Last data filed at 12/24/2019 0727 Gross per 24 hour  Intake 660 ml  Output --  Net 660 ml     Physical Exam: Vital Signs Blood pressure 120/90, pulse 87, temperature 99.1 F (37.3 C), resp. rate 19, height 5\' 7"  (1.702 m), weight 103.4 kg, SpO2 97 %.   General: No acute distress Mood and affect are appropriate Heart: Regular rate and rhythm no rubs murmurs or extra sounds Lungs: Clear to auscultation, breathing unlabored, no rales or wheezes Abdomen: Positive bowel sounds, soft nontender to palpation, nondistended Extremities: No clubbing, cyanosis, or edema Skin: No evidence of breakdown, no evidence of rash  Neurologic: Cranial nerves II through XII intact, motor strength is 5/5 in bilateral deltoid, bicep, tricep, grip, hip flexor, knee extensors, ankle dorsiflexor and plantar flexor 3/5 Hip ext  Musculoskeletal: Full range of motion in all 4 extremities. No joint swelling  Assessment/Plan: 1. Functional deficits secondary to CIM which require 3+ hours per day of interdisciplinary therapy in a comprehensive inpatient rehab setting.  Physiatrist is providing close team supervision and 24 hour management of active medical problems listed below.  Physiatrist and rehab team continue to assess barriers to discharge/monitor patient progress toward functional and medical goals  Care Tool:  Bathing    Body parts bathed by patient: Front perineal area, Face, Right arm, Left arm, Chest, Abdomen, Right upper leg, Left upper leg, Right  lower leg, Left lower leg, Buttocks   Body parts bathed by helper: Front perineal area, Buttocks, Right upper leg, Left upper leg, Right lower leg, Left lower leg Body parts n/a: Right arm, Left arm, Chest, Abdomen   Bathing assist Assist Level: Supervision/Verbal cueing     Upper Body Dressing/Undressing Upper body dressing   What is the patient wearing?: Pull over shirt    Upper body assist Assist Level: Set up assist    Lower Body Dressing/Undressing Lower body dressing      What is the patient wearing?: Pants     Lower body assist Assist for lower body dressing: Supervision/Verbal cueing (Bed level)     Toileting Toileting    Toileting assist Assist for toileting: Moderate Assistance - Patient 50 - 74%     Transfers Chair/bed transfer  Transfers assist     Chair/bed transfer assist level: Supervision/Verbal cueing Chair/bed transfer assistive device: Sliding board   Locomotion Ambulation   Ambulation assist   Ambulation activity did not occur: Safety/medical concerns  Assist level: 2 helpers Assistive device: Parallel bars Max distance: 6ft   Walk 10 feet activity   Assist  Walk 10 feet activity did not occur: Safety/medical concerns  Assist level: 2 helpers Assistive device: Parallel bars   Walk 50 feet activity   Assist Walk 50 feet with 2 turns activity did not occur: Safety/medical concerns         Walk 150 feet activity   Assist Walk 150 feet activity did not occur: Safety/medical concerns         Walk 10 feet  on uneven surface  activity   Assist Walk 10 feet on uneven surfaces activity did not occur: Safety/medical concerns         Wheelchair     Assist Will patient use wheelchair at discharge?: Yes Type of Wheelchair: Manual    Wheelchair assist level: Supervision/Verbal cueing Max wheelchair distance: 150    Wheelchair 50 feet with 2 turns activity    Assist        Assist Level:  Supervision/Verbal cueing   Wheelchair 150 feet activity     Assist      Assist Level: Supervision/Verbal cueing, Minimal Assistance - Patient > 75%   Blood pressure 120/90, pulse 87, temperature 99.1 F (37.3 C), resp. rate 19, height 5\' 7"  (1.702 m), weight 103.4 kg, SpO2 97 %.  Medical Problem List and Plan: 1.  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  Continue prednisone with taper Currently 20mg  daily - add flutter valve  9/5- will add Albuterol inhlaer 1-2 puffs q4 hours prn for a little wheezing/cough- lungs sound good today but given increased symptoms will do f/u CXR, likely having problem with prednisone taper , will increase back to 15mg  and taper slowly from there   -Currently with 5 L oxygen nasal cannula             -patient may  shower             -ELOS/Goals:  Mod I to supervision 9/10  2.  Antithrombotics: -DVT/anticoagulation: Bilateral lower extremity DVTs.  Patient will remain on Eliquis 5 mg twice daily for a minimum of 3 months total             -antiplatelet therapy: N/A 3. Pain Management: Hydrocodone 1 tablet every 6 hours as needed- pt said she took Percocet 10/325 mg QID at home- Added heat , Kpad. Well controlled.   9/4- pain well controlled except for sinus pressure- con't regimen 4. Mood: Clonazepam 0.5 mg 3 times daily, Paxil 40 mg daily, melatonin 6 mg nightly- will see if possible to wean  -would benefit from neuropsych referral to help deal with situational anxiety             -antipsychotic agents: N/A  5. Neuropsych: This patient is capable of making decisions on her own behalf. 6. Skin/Wound Care: Routine skin checks 7. Fluids/Electrolytes/Nutrition: Routine in and outs with follow-up chemistries. Added Boost BID as per patient's request 8.  Hypothyroidism.  Continue Synthroid 25 mcg Monday Wednesday Friday 9.  Hypertension.  Lasix 40 mg daily, metoprolol 50 mg twice daily Vitals:   12/23/19 1514 12/24/19 0443  BP:   120/90  Pulse:  87  Resp:  19  Temp:  99.1 F (37.3 C)  SpO2: 100% 97%  9/5- BP controlled- con't regimen 10.  Hyperlipidemia.  Lipitor 40 mg daily 11.  History of gastric bypass 2017.  Dietary follow-up 12.  History left TKA April 2021.  Weightbearing as tolerated- left knee actually hurts more than RIght  And needs TKR on R knee, per pt. 13. Grounds pass if portable O2 is available  14.  Post infectious cough on Dulera, on Prednisone taper. Will slow down taper Recheck CXR           15.  Acute  sinusitis with max sinus tenderness maxillofacial CT of sinuses demonstrates some fluid in maxillary sinuses as well as post op changes, start Augmentin, Afrin spray  9/4- will add sudafed 60 mg q6 hours prn for sinus pressure  Still with some max area pressure    LOS: 20 days A FACE TO FACE EVALUATION WAS PERFORMED  Erick Colace 12/24/2019, 10:02 AM

## 2019-12-24 NOTE — Progress Notes (Signed)
Physical Therapy Session Note  Patient Details  Name: Bonnie Ballard MRN: [id number] Date of Birth: [birth year]  Today's Date: 12/24/2019 PT Individual Time: 1340-1440 PT Individual Time Calculation (min): 60 min  PT Amount of Missed Time (min): 15 Minutes PT Missed Treatment Reason: Patient fatigue  Short Term Goals: Week 3:  PT Short Term Goal 1 (Week 3): STG=LTG due to ELOS  Skilled Therapeutic Interventions/Progress Updates:    Pt received seated in w/c in room, agreeable to PT session. Pt reports her stomach feels upset this PM and overall feeling very fatigued from sinus infection. Pt initially on 4L O2 at rest, SpO2 at 96% and higher. Manual w/c propulsion 2 x 50 ft with use of BUE at Supervision level before onset of fatigue. SpO2 drops to 89% with activity but then quickly returns to 96% (+) while on 4L O2 initially. Pt requesting to go down to gift shop to purchase more cough drops. Assisted pt with navigating functional environment of elevators and crowded hallways in w/c. Gift shop found to be closed, RN notified that pt requesting cough drops. Sit to stand in standing frame x 1 repetition. Pt unable to remain standing due to fatigue and requests to return to sitting, declines further attempts at standing this date due to fatigue. SpO2 drops into 80's with standing while on 4L O2, increased to 6L O2 and SpO2 remains at 97% (+) for remainder of session. Attempt to have pt perform slide board transfer to mat table, pt reports feeling too fatigued to continue therapy session. Slide board transfer w/c to bed with CGA, several seated rest breaks during transfer due to fatigue. Sit to supine Supervision. Pt also found to exhibit ongoing anxiety this session. Reviewed deep breathing techniques for relaxation. Pt tearful and upset about her inability to fully functionally participate in session this date. Provided emotional support and encouragement. Pt left seated in bed with needs in reach, bed alarm  in place. Pt missed 15 min of scheduled therapy session due to fatigue.  Therapy Documentation Precautions:  Precautions Precautions: Fall, Other (comment) Precaution Comments: 5-7L via [place] Restrictions Weight Bearing Restrictions: No General: PT Amount of Missed Time (min): 15 Minutes PT Missed Treatment Reason: Patient fatigue    Therapy/Group: Individual Therapy   Peter Congo, PT, DPT  12/24/2019, 5:08 PM

## 2019-12-24 NOTE — Progress Notes (Addendum)
Occupational Therapy Session Note  Patient Details  Name: Bonnie Ballard MRN: [id number] Date of Birth: [birth year]  Today's Date: 12/24/2019 OT Individual Time: 4514-6047 OT Individual Time Calculation (min): 60 min    Short Term Goals: Week 3:  OT Short Term Goal 1 (Week 3): Patient will complete LB bathing seated EOB with Min A and lateral leans. OT Short Term Goal 2 (Week 3): Patient will complete 3/3 parts of toileting task seated on BSC. OT Short Term Goal 3 (Week 3): Patient will complete tub/shower transfer to TTB in prep for bathing task.  Skilled Therapeutic Interventions/Progress Updates:  Patient met lying supine in bed in agreement with OT treatment session with focus on self-care re-education and functional transfers as detailed below. Patient reporting 0/10 pain at rest and with activity but endorses cough, fatigue, and sinus congestion. Patient requiring increased rest breaks and 5-6L via [place] with BADLs in supine and at EOB. Patient initially with Spo2 100% at rest but noted feeling SOB with light activity. SpO2 83-86% with supine to EOB and functional transfer with use of SB to wc. Patient completed LB bathing and dressing at bed level with supervision A and rolling R<>L. UB bathing/dressing seated at EOB with set-up assist. Grooming tasks completed seated at sink level with independence. BLE ther. wx. with 3lb ankle weights to improve strength in prep for functional transfers. Session concluded with patient seated in wc with call bell within reach, and all needs met.   Therapy Documentation Precautions:  Precautions Precautions: Fall, Other (comment) Precaution Comments: 5-7L via [place] Restrictions Weight Bearing Restrictions: No General:    Therapy/Group: Individual Therapy  Shamera Yarberry R [place]Davis 12/24/2019, 7:25 AM

## 2019-12-24 NOTE — Progress Notes (Signed)
Physical Therapy Session Note  Patient Details  Name: Bonnie Ballard MRN: [id number] Date of Birth: Oct 20, 1965  Today's Date: 12/24/2019 PT Individual Time: 0950-1045 PT Individual Time Calculation (min): 55 min   Short Term Goals: Week 3:  PT Short Term Goal 1 (Week 3): STG=LTG due to ELOS  Skilled Therapeutic Interventions/Progress Updates: Pt presented in w/c agreeable to therapy. Pt with some increased anxiety with increased coughing and requesting chest x-ray. MD arrived shortly after for daily assessment and pt was able to express needs. Once completed PTA discussed with pt set up for car transfer. Pt agreeable to try SB transfer. Pt transported to ortho gym and pt at start only able to indicate compact SUV. PTA raised seat height to 26 in indicating that actual vehicle may be higher than that. Pt was able to perform SB transfer to 26in height with CGA and increased time as well as on 4L with desat to 85% and recovery to 90% within 2 min. Pt then informed that car seat 29in and pt felt due to workload of 26in would not be able to perform 29in, pt will attempt to find alternative ride that has lower seat height. PTA also brought up family education. Pt to discuss with dgt later today if possible to come in Wednesday or Thursday for family ed. Pt propelled back to room taking x 3 brief rest breaks on 4L with lowest desat to 87%. Performed SB transfer to bed keeping on 4L with desat to 85% however was able to recovery within 2-3 min. Pt repositioned to comfort and left with bed alarm on, call bell within reach and needs met.      Therapy Documentation Precautions:  Precautions Precautions: Fall, Other (comment) Precaution Comments: 5-7L via [place] Restrictions Weight Bearing Restrictions: No General:   Vital Signs: Therapy Vitals Temp: 97.9 F (36.6 C) Pulse Rate: 99 Resp: 19 BP: 93/68 Patient Position (if appropriate): Lying Oxygen Therapy SpO2: 98 % O2 Device: Nasal Cannula O2 Flow Rate  (L/min): 5 L/min Pain: Pain Assessment Pain Scale: 0-10 Pain Score: 3  Pain Type: Acute pain Pain Location: Leg Pain Orientation: Right;Left Pain Descriptors / Indicators: Aching Pain Frequency: Constant Pain Onset: On-going Patients Stated Pain Goal: 2 Pain Intervention(s): Medication (See eMAR);Repositioned   Therapy/Group: Individual Therapy  Niaja Stickley  Si Jachim, PTA  12/24/2019, 4:25 PM

## 2019-12-25 ENCOUNTER — Inpatient Hospital Stay (HOSPITAL_COMMUNITY): Payer: Medicare Other | Admitting: Occupational Therapy

## 2019-12-25 ENCOUNTER — Inpatient Hospital Stay (HOSPITAL_COMMUNITY): Payer: Medicare Other | Admitting: Physical Therapy

## 2019-12-25 MED ORDER — ACETYLCYSTEINE 20 % IN SOLN
3.0000 mL | Freq: Two times a day (BID) | RESPIRATORY_TRACT | Status: DC
  Administered 2019-12-25 (×2): 3 mL via RESPIRATORY_TRACT
  Filled 2019-12-25 (×5): qty 4

## 2019-12-25 NOTE — Progress Notes (Signed)
Occupational Therapy Session Note  Patient Details  Name: Bonnie Ballard MRN: [id number] Date of Birth: 1965/09/15  Today's Date: 12/25/2019 OT Individual Time: 0732-0829 OT Individual Time Calculation (min): 57 min    Short Term Goals: Week 3:  OT Short Term Goal 1 (Week 3): Patient will complete LB bathing seated EOB with Min A and lateral leans. OT Short Term Goal 2 (Week 3): Patient will complete 3/3 parts of toileting task seated on BSC. OT Short Term Goal 3 (Week 3): Patient will complete tub/shower transfer to TTB in prep for bathing task.  Skilled Therapeutic Interventions/Progress Updates:  Patient met lying supine in bed in agreement with OT treatment session with focus self-care re-education and functional transfers as detailed below. No c/o pain at rest or with activity but patient continues to endorse symptoms of sinusitis. Supine to EOB with independence and heavy use of bed features. Patient able to don shoes seated EOB with set-up assist. Patient with need to void bladder requiring Min A for SB transfer "uphill" to [facility]. Patient able to complete 3/3 parts of toileting task with Min A for clothing management only. Grooming seated at sink level with independence. Total A for wc transport to ADL apartment for energy conservation and time management in prep for shower transfer with use of TTB. Upon entering bathroom, patient indicated need for BM. Total A back to room for toileting task. 3/3 parts of toileting task completed in same manner as indicated above. Session concluded with patient lying supine in bed with call bell within reach and bed alarm activated.   Therapy Documentation Precautions:  Precautions Precautions: Fall, Other (comment) Precaution Comments: 5-7L via [place] Restrictions Weight Bearing Restrictions: No General:    Therapy/Group: Individual Therapy  Haidynn Almendarez R [place] 12/25/2019, 7:23 AM

## 2019-12-25 NOTE — Progress Notes (Signed)
[facility] PHYSICAL MEDICINE & REHABILITATION PROGRESS NOTE    Still with cough, difficulty with mobilizing secretions despite use of flutter valve  ROS:    ROS- neg CP, SOB, N/V/D    Objective:   DG Chest 2 View  Result Date: 12/24/2019 CLINICAL DATA:  Cough EXAM: CHEST - 2 VIEW COMPARISON:  11/29/2019 FINDINGS: Low lung volumes. Faint subpleural/peripheral pulmonary opacities in the lungs bilaterally with increased interstitial prominence. This appearance is chronic on prior studies and favors chronic interstitial lung disease. A superimposed acute process is difficult to exclude but is not favored. No pleural effusion or pneumothorax. The heart is top-normal in size. IMPRESSION: Low lung volumes with suspected chronic interstitial lung disease. Superimposed acute process is difficult to exclude but is not favored. Electronically Signed   By: Charline Bills M.D.   On: 12/24/2019 12:02   No results for input(s): WBC, HGB, HCT, PLT in the last 72 hours. No results for input(s): NA, K, CL, CO2, GLUCOSE, BUN, CREATININE, CALCIUM in the last 72 hours.  Intake/Output Summary (Last 24 hours) at 12/25/2019 0748 Last data filed at 12/24/2019 1352 Gross per 24 hour  Intake 360 ml  Output --  Net 360 ml     Physical Exam: Vital Signs Blood pressure (!) 138/113, pulse 60, temperature 97.8 F (36.6 C), resp. rate 20, height 5\' 7"  (1.702 m), weight 103.4 kg, SpO2 96 %.   General: No acute distress Mood and affect are appropriate Heart: Regular rate and rhythm no rubs murmurs or extra sounds Lungs: Clear to auscultation, breathing unlabored, no rales or wheezes Abdomen: Positive bowel sounds, soft nontender to palpation, nondistended Extremities: No clubbing, cyanosis, or edema Skin: No evidence of breakdown, no evidence of rash  Neurologic: Cranial nerves II through XII intact, motor strength is 5/5 in bilateral deltoid, bicep, tricep, grip, hip flexor, knee extensors, ankle dorsiflexor  and plantar flexor 3/5 Hip ext  Musculoskeletal: Full range of motion in all 4 extremities. No joint swelling  Assessment/Plan: 1. Functional deficits secondary to CIM which require 3+ hours per day of interdisciplinary therapy in a comprehensive inpatient rehab setting.  Physiatrist is providing close team supervision and 24 hour management of active medical problems listed below.  Physiatrist and rehab team continue to assess barriers to discharge/monitor patient progress toward functional and medical goals  Care Tool:  Bathing    Body parts bathed by patient: Front perineal area, Face, Right arm, Left arm, Chest, Abdomen, Right upper leg, Left upper leg, Right lower leg, Left lower leg, Buttocks   Body parts bathed by helper: Front perineal area, Buttocks, Right upper leg, Left upper leg, Right lower leg, Left lower leg Body parts n/a: Right arm, Left arm, Chest, Abdomen   Bathing assist Assist Level: Supervision/Verbal cueing     Upper Body Dressing/Undressing Upper body dressing   What is the patient wearing?: Pull over shirt    Upper body assist Assist Level: Set up assist    Lower Body Dressing/Undressing Lower body dressing      What is the patient wearing?: Pants     Lower body assist Assist for lower body dressing: Supervision/Verbal cueing (Bed level)     Toileting Toileting    Toileting assist Assist for toileting: Moderate Assistance - Patient 50 - 74%     Transfers Chair/bed transfer  Transfers assist     Chair/bed transfer assist level: Supervision/Verbal cueing Chair/bed transfer assistive device: Sliding board   Locomotion Ambulation   Ambulation assist   Ambulation  activity did not occur: Safety/medical concerns  Assist level: 2 helpers Assistive device: Parallel bars Max distance: 19ft   Walk 10 feet activity   Assist  Walk 10 feet activity did not occur: Safety/medical concerns  Assist level: 2 helpers Assistive device:  Parallel bars   Walk 50 feet activity   Assist Walk 50 feet with 2 turns activity did not occur: Safety/medical concerns         Walk 150 feet activity   Assist Walk 150 feet activity did not occur: Safety/medical concerns         Walk 10 feet on uneven surface  activity   Assist Walk 10 feet on uneven surfaces activity did not occur: Safety/medical concerns         Wheelchair     Assist Will patient use wheelchair at discharge?: Yes Type of Wheelchair: Manual    Wheelchair assist level: Supervision/Verbal cueing Max wheelchair distance: 150    Wheelchair 50 feet with 2 turns activity    Assist        Assist Level: Supervision/Verbal cueing   Wheelchair 150 feet activity     Assist      Assist Level: Supervision/Verbal cueing, Minimal Assistance - Patient > 75%   Blood pressure (!) 138/113, pulse 60, temperature 97.8 F (36.6 C), resp. rate 20, height 5\' 7"  (1.702 m), weight 103.4 kg, SpO2 96 %.  Medical Problem List and Plan: 1.  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  Continue prednisone with taper Currently 15mg  daily - add flutter valve  9/5- will add Albuterol inhlaer 1-2 puffs q4 hours prn for a little wheezing/cough- add mucomyst for post covid cough -Currently with 5 L oxygen nasal cannula             -patient may  shower             -ELOS/Goals:  Mod I to supervision 9/10  2.  Antithrombotics: -DVT/anticoagulation: Bilateral lower extremity DVTs.  Patient will remain on Eliquis 5 mg twice daily for a minimum of 3 months total             -antiplatelet therapy: N/A 3. Pain Management: Hydrocodone 1 tablet every 6 hours as needed- pt said she took Percocet 10/325 mg QID at home- Added heat , Kpad. Well controlled.   9/4- pain well controlled except for sinus pressure- con't regimen 4. Mood: Clonazepam 0.5 mg 3 times daily, Paxil 40 mg daily, melatonin 6 mg nightly- will see if possible to wean  -would  benefit from neuropsych referral to help deal with situational anxiety             -antipsychotic agents: N/A  5. Neuropsych: This patient is capable of making decisions on her own behalf. 6. Skin/Wound Care: Routine skin checks 7. Fluids/Electrolytes/Nutrition: Routine in and outs with follow-up chemistries. Added Boost BID as per patient's request 8.  Hypothyroidism.  Continue Synthroid 25 mcg Monday Wednesday Friday 9.  Hypertension.  Lasix 40 mg daily, metoprolol 50 mg twice daily Vitals:   12/24/19 2002 12/25/19 0424  BP: 123/76 (!) 138/113  Pulse: 90 60  Resp: 18 20  Temp: 97.7 F (36.5 C) 97.8 F (36.6 C)  SpO2: 99% 96%  high diastolic will recheck , appears to be outlier but may be sudafed related but last dose was ~24 hours ago 10.  Hyperlipidemia.  Lipitor 40 mg daily 11.  History of gastric bypass 2017.  Dietary follow-up 12.  History left TKA April  2021.  Weightbearing as tolerated- left knee actually hurts more than RIght  And needs TKR on R knee, per pt. 13. Grounds pass if portable O2 is available  14.  Post infectious cough on Dulera, on Prednisone taper. Will slow down taper Recheck CXR           15.  Acute  sinusitis with max sinus tenderness maxillofacial CT of sinuses demonstrates some fluid in maxillary sinuses as well as post op changes, start Augmentin, Afrin spray  9/4- will add sudafed 60 mg q6 hours prn for sinus pressure  Improving    LOS: 21 days A FACE TO FACE EVALUATION WAS PERFORMED  Erick Colace 12/25/2019, 7:48 AM

## 2019-12-25 NOTE — Progress Notes (Signed)
Physical Therapy Session Note  Patient Details  Name: Bonnie Ballard MRN: [id number] Date of Birth: 1965/07/07  Today's Date: 12/25/2019 PT Individual Time: 1105-1200 and 1336-1415 PT Individual Time Calculation (min): 55 min and 39 min  Short Term Goals: Week 3:  PT Short Term Goal 1 (Week 3): STG=LTG due to ELOS  Skilled Therapeutic Interventions/Progress Updates: Pt presented in bed agreeable to therapy. Pt states she feels a little better than yesterday. Pt performed supine to sit mod I with use of bed features. Pt was able to set up SB on level surface and perform SB transfer with supervision. Pt propelled to ortho gym and performed car transfer via SB to 27in height. Pt required minA due to height but was able to complete while on 6L and maintain SpO2>90% however required increased time for recovery. Pt returned to w/c via SB with CGA. Pt then participated in Standing frame 2 bouts for approx 2 min ea. On first attempt pt attempted to perform TKE with pt able to engage quads and glutes however desat to low 80s due to effort and required 1-33mn for recovery after seated rest. On second attempt pt stood passively with frame and was able to maintain SpO2 >90%. Pt then propelled back to room on 6L requiring x 3 brief rest breaks but able to maintain >90%. Pt remained in w/c at end of session and left with call bell within reach and needs met.    [facility] and returned x 5 min later once pt completed BM. Pt performed lateral leans to pull pants over hips and required minA from PTA to complete. Pt then performed SB transfer with pt setting up SB with supervision and verbal cues. Performed SB transfer with SBA. Pt required increased time between activities this session due to fatigue. Pt then transported to day room and used Cybex Kinetron 50cm/sec x 3 min for two bouts. Pt desat to 88% on 6L with near immediate recovery. Pt required 3-4 min rest between bouts. Pt propelled back  to room and performed SB transfer with pt able to set up SB with verbal cues and supervision. Pt returned to bed with HOB elevated and use of bed feautres. Pt repositioned to comfort and left with bed alarm on, call bell within reach and needs met.      Therapy Documentation Precautions:  Precautions Precautions: Fall, Other (comment) Precaution Comments: 5-7L via [place] Restrictions Weight Bearing Restrictions: No General:   Vital Signs: Oxygen Therapy SpO2: 97 % O2 Device: Nasal Cannula O2 Flow Rate (L/min): 5 L/min    Therapy/Group: Individual Therapy  Kenza Munar  Merl Guardino, PTA  12/25/2019, 12:39 PM

## 2019-12-25 NOTE — Progress Notes (Signed)
Occupational Therapy Session Note  Patient Details  Name: Bonnie Ballard MRN: [id number] Date of Birth: 18-Apr-1966  Today's Date: 12/25/2019 OT Individual Time: 1300-1330 OT Individual Time Calculation (min): 30 min    Short Term Goals: Week 3:  OT Short Term Goal 1 (Week 3): Patient will complete LB bathing seated EOB with Min A and lateral leans. OT Short Term Goal 2 (Week 3): Patient will complete 3/3 parts of toileting task seated on BSC. OT Short Term Goal 3 (Week 3): Patient will complete tub/shower transfer to TTB in prep for bathing task.  Skilled Therapeutic Interventions/Progress Updates:    Patient seated in w/c, she denies pain but states that she needs to use the bathroom urgently.  On 6L O2 via [place].  SB transfer w/c to/from drop arm commode with set up and CGA.  She is able to manage pants down with lateral weight shift and is continent of urine, set up for hygiene and min A pants up in the back.  Fatigue/SOB noted with transfer and clothing management.  Assisted to tub/shower and practiced SB transfer to/from tub bench - she was able to complete with CGA and increased time to include legs over edge of tub.  Reviewed home set up and potential placement of grab bars in the future.  She remained seated in w/c on wall O2 at close of session, call bell in hand.    Therapy Documentation Precautions:  Precautions Precautions: Fall, Other (comment) Precaution Comments: 5-7L via [place] Restrictions Weight Bearing Restrictions: No   Therapy/Group: Individual Therapy  Barrie Lyme 12/25/2019, 7:45 AM

## 2019-12-26 ENCOUNTER — Ambulatory Visit (HOSPITAL_COMMUNITY): Payer: Medicare Other | Admitting: Physical Therapy

## 2019-12-26 ENCOUNTER — Inpatient Hospital Stay (HOSPITAL_COMMUNITY): Payer: Medicare Other | Admitting: Occupational Therapy

## 2019-12-26 ENCOUNTER — Encounter (HOSPITAL_COMMUNITY): Payer: Medicare Other

## 2019-12-26 ENCOUNTER — Inpatient Hospital Stay (HOSPITAL_COMMUNITY): Payer: Medicare Other | Admitting: Physical Therapy

## 2019-12-26 NOTE — Progress Notes (Signed)
[place] PHYSICAL MEDICINE & REHABILITATION PROGRESS NOTE    Still with cough, tried mucomyst, doesn't like smell, did not help with cough yet  ROS:    ROS- neg CP, SOB, N/V/D    Objective:   DG Chest 2 View  Result Date: 12/24/2019 CLINICAL DATA:  Cough EXAM: CHEST - 2 VIEW COMPARISON:  11/29/2019 FINDINGS: Low lung volumes. Faint subpleural/peripheral pulmonary opacities in the lungs bilaterally with increased interstitial prominence. This appearance is chronic on prior studies and favors chronic interstitial lung disease. A superimposed acute process is difficult to exclude but is not favored. No pleural effusion or pneumothorax. The heart is top-normal in size. IMPRESSION: Low lung volumes with suspected chronic interstitial lung disease. Superimposed acute process is difficult to exclude but is not favored. Electronically Signed   By: Charline Bills M.D.   On: 12/24/2019 12:02   No results for input(s): WBC, HGB, HCT, PLT in the last 72 hours. No results for input(s): NA, K, CL, CO2, GLUCOSE, BUN, CREATININE, CALCIUM in the last 72 hours.  Intake/Output Summary (Last 24 hours) at 12/26/2019 0841 Last data filed at 12/25/2019 1948 Gross per 24 hour  Intake 1040 ml  Output --  Net 1040 ml     Physical Exam: Vital Signs Blood pressure 112/74, pulse 85, temperature 98 F (36.7 C), temperature source Oral, resp. rate (!) 22, height 5\' 7"  (1.702 m), weight 103.4 kg, SpO2 95 %.   General: No acute distress Mood and affect are appropriate Heart: Regular rate and rhythm no rubs murmurs or extra sounds Lungs: Clear to auscultation, breathing unlabored, no rales or wheezes Abdomen: Positive bowel sounds, soft nontender to palpation, nondistended Extremities: No clubbing, cyanosis, or edema Skin: No evidence of breakdown, no evidence of rash  Neurologic: Cranial nerves II through XII intact, motor strength is 5/5 in bilateral deltoid, bicep, tricep, grip, hip flexor, knee  extensors, ankle dorsiflexor and plantar flexor 3/5 Hip ext  Musculoskeletal: Full range of motion in all 4 extremities. No joint swelling  Assessment/Plan: 1. Functional deficits secondary to CIM which require 3+ hours per day of interdisciplinary therapy in a comprehensive inpatient rehab setting.  Physiatrist is providing close team supervision and 24 hour management of active medical problems listed below.  Physiatrist and rehab team continue to assess barriers to discharge/monitor patient progress toward functional and medical goals  Care Tool:  Bathing    Body parts bathed by patient: Front perineal area, Face, Right arm, Left arm, Chest, Abdomen, Right upper leg, Left upper leg, Right lower leg, Left lower leg, Buttocks   Body parts bathed by helper: Front perineal area, Buttocks, Right upper leg, Left upper leg, Right lower leg, Left lower leg Body parts n/a: Right arm, Left arm, Chest, Abdomen   Bathing assist Assist Level: Supervision/Verbal cueing     Upper Body Dressing/Undressing Upper body dressing   What is the patient wearing?: Pull over shirt    Upper body assist Assist Level: Set up assist    Lower Body Dressing/Undressing Lower body dressing      What is the patient wearing?: Pants     Lower body assist Assist for lower body dressing: Supervision/Verbal cueing (Bed level)     Toileting Toileting    Toileting assist Assist for toileting: Minimal Assistance - Patient > 75%     Transfers Chair/bed transfer  Transfers assist     Chair/bed transfer assist level: Set up assist Chair/bed transfer assistive device: Sliding board   Locomotion Ambulation  Ambulation assist   Ambulation activity did not occur: Safety/medical concerns  Assist level: 2 helpers Assistive device: Parallel bars Max distance: 24ft   Walk 10 feet activity   Assist  Walk 10 feet activity did not occur: Safety/medical concerns  Assist level: 2 helpers Assistive  device: Parallel bars   Walk 50 feet activity   Assist Walk 50 feet with 2 turns activity did not occur: Safety/medical concerns         Walk 150 feet activity   Assist Walk 150 feet activity did not occur: Safety/medical concerns         Walk 10 feet on uneven surface  activity   Assist Walk 10 feet on uneven surfaces activity did not occur: Safety/medical concerns         Wheelchair     Assist Will patient use wheelchair at discharge?: Yes Type of Wheelchair: Manual    Wheelchair assist level: Supervision/Verbal cueing Max wheelchair distance: 150    Wheelchair 50 feet with 2 turns activity    Assist        Assist Level: Supervision/Verbal cueing   Wheelchair 150 feet activity     Assist      Assist Level: Supervision/Verbal cueing, Minimal Assistance - Patient > 75%   Blood pressure 112/74, pulse 85, temperature 98 F (36.7 C), temperature source Oral, resp. rate (!) 22, height 5\' 7"  (1.702 m), weight 103.4 kg, SpO2 95 %.  Medical Problem List and Plan: 1.  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  Continue prednisone with taper Currently 15mg  daily - add flutter valve  9/5- will add Albuterol inhlaer 1-2 puffs q4 hours prn for a little wheezing/cough- add mucomyst for post covid cough -Currently with 5 L oxygen nasal cannula             -patient may  shower             -ELOS/Goals:  Mod I to supervision 9/10  2.  Antithrombotics: -DVT/anticoagulation: Bilateral lower extremity DVTs.  Patient will remain on Eliquis 5 mg twice daily for a minimum of 3 months total             -antiplatelet therapy: N/A 3. Pain Management: Hydrocodone 1 tablet every 6 hours as needed- pt said she took Percocet 10/325 mg QID at home- Added heat , Kpad. Well controlled.   9/4- pain well controlled except for sinus pressure- con't regimen 4. Mood: Clonazepam 0.5 mg 3 times daily, Paxil 40 mg daily, melatonin 6 mg nightly- will see if  possible to wean  -would benefit from neuropsych referral to help deal with situational anxiety             -antipsychotic agents: N/A  5. Neuropsych: This patient is capable of making decisions on her own behalf. 6. Skin/Wound Care: Routine skin checks 7. Fluids/Electrolytes/Nutrition: Routine in and outs with follow-up chemistries. Added Boost BID as per patient's request 8.  Hypothyroidism.  Continue Synthroid 25 mcg Monday Wednesday Friday 9.  Hypertension.  Lasix 40 mg daily, metoprolol 50 mg twice daily Vitals:   12/25/19 2019 12/26/19 0519  BP: 118/83 112/74  Pulse: (!) 109 85  Resp: 20 (!) 22  Temp: (!) 97.5 F (36.4 C) 98 F (36.7 C)  SpO2: 95% 95%  high diastolic will recheck , appears to be outlier but may be sudafed related but last dose was ~24 hours ago 10.  Hyperlipidemia.  Lipitor 40 mg daily 11.  History of gastric bypass  2017.  Dietary follow-up 12.  History left TKA April 2021.  Weightbearing as tolerated- left knee actually hurts more than RIght  And needs TKR on R knee, per pt. 13. Grounds pass if portable O2 is available  14.  Post infectious cough on Dulera, on Prednisone taper. Will slow down taper Recheck CXR           15.  Acute  sinusitis with max sinus tenderness maxillofacial CT of sinuses demonstrates some fluid in maxillary sinuses as well as post op changes, start Augmentin will finish tomorrow , Afrin spray     LOS: 22 days A FACE TO FACE EVALUATION WAS PERFORMED  Erick Colace 12/26/2019, 8:41 AM

## 2019-12-26 NOTE — Progress Notes (Signed)
Physical Therapy Session Note  Patient Details  Name: Bonnie Ballard MRN: [id number] Date of Birth: March 27, 1966  Today's Date: 12/26/2019 PT Individual Time: 1105-1200 PT Individual Time Calculation (min): 55 min   Short Term Goals: Week 3:  PT Short Term Goal 1 (Week 3): STG=LTG due to ELOS  Skilled Therapeutic Interventions/Progress Updates:   Pt received sitting in WC and agreeable to PT. Pt performed WC mobility throughout Gram x 130f without cues or assist. SB transfer to and from mat table with cues for set up assist. Pt able to place and remove SB with increased time and min cues for WC parts management. Additional WC mobility through [place] x 1858fwithout cues or assist from PT.   Sit<>stand with bariatric RW x 3 with min assist and elevated bed height. Pt tolerates standing in WC 2 x 20seconds. Pt then perform pre-gait stepping x 2 BLE with BUE Support on RW and min assist from PT. No noted knee instability.   PT instructed pt LE therex. LAQ 2 x 12, ankle DF 2 x 20. And hip abduction with level 2 tband x 12. Cues for full ROM, increased time at end range and decreased speed to improve strengthening aspects of movement.   [facility] to [facility] and all needs met.   Pt maintained on 6L/min throughout session with multiple desat to 86% following each bout of physical activity, requiring up to 1 min to return to >90%.        Therapy Documentation Precautions:  Precautions Precautions: Fall, Other (comment) Precaution Comments: 5-7L via [place] Restrictions Weight Bearing Restrictions: No   Pain: Pain Assessment Pain Scale: 0-10 Pain Score: 7  Pain Type: Acute pain Pain Location: Leg Pain Orientation: Right;Left Pain Descriptors / Indicators: Aching;Sharp Pain Onset: On-going Patients Stated Pain Goal: 2 Pain Intervention(s): Medication (See eMAR) Multiple Pain Sites: No    Therapy/Group: Individual Therapy  AuLorie Phenix/11/2019, 12:58 PM

## 2019-12-26 NOTE — Plan of Care (Signed)
  Problem: Consults Goal: RH GENERAL PATIENT EDUCATION Description: See Patient Education module for education specifics. Outcome: Progressing   Problem: RH BOWEL ELIMINATION Goal: RH STG MANAGE BOWEL WITH ASSISTANCE Description: STG Manage Bowel with sup Assistance. Outcome: Progressing   Problem: RH BLADDER ELIMINATION Goal: RH STG MANAGE BLADDER WITH ASSISTANCE Description: STG Manage Bladder With Supv Assistance Outcome: Progressing   Problem: RH SKIN INTEGRITY Goal: RH STG SKIN FREE OF INFECTION/BREAKDOWN Description: Skin will be free of infection/breakdown with min assist Outcome: Progressing Goal: RH STG MAINTAIN SKIN INTEGRITY WITH ASSISTANCE Description: STG Maintain Skin Integrity With min Assistance. Outcome: Progressing   Problem: RH SAFETY Goal: RH STG ADHERE TO SAFETY PRECAUTIONS W/ASSISTANCE/DEVICE Description: STG Adhere to Safety Precautions With MIN Assistance/Device. Outcome: Progressing   Problem: RH PAIN MANAGEMENT Goal: RH STG PAIN MANAGED AT OR BELOW PT'S PAIN GOAL Description: Pain will be managed at 3 out of 10 at MOD I  Outcome: Progressing   Problem: RH KNOWLEDGE DEFICIT GENERAL Goal: RH STG INCREASE KNOWLEDGE OF SELF CARE AFTER HOSPITALIZATION Description: Pt will be able to verbalize self care related to handouts, therapy, and personalization for independent care with min assist  Outcome: Progressing   

## 2019-12-26 NOTE — Progress Notes (Signed)
Patient ID: Bonnie Ballard, female   DOB: Sep 13, 1965, 54 y.o.   MRN: [id number] Team Conference Report to Patient/Family  Team Conference discussion was reviewed with the patient and caregiver, including goals, any changes in plan of care and target discharge date.  Patient and caregiver express understanding and are in agreement.  The patient has a target discharge date of 12/28/19.  [name] 12/26/2019, 1:51 PM

## 2019-12-26 NOTE — Discharge Summary (Signed)
Physician Discharge Summary  Patient ID: Bonnie Ballard MRN: [id number] DOB/AGE: Sep 22, 1965 54 y.o.  Admit date: 12/04/2019 Discharge date: 12/28/2019  Discharge Diagnoses:  Principal Problem:   Intensive care (ICU) myopathy Active Problems:   Pneumonia due to COVID-19 virus   Critical illness myopathy DVT prophylaxis Mood stabilization Hyperlipidemia History of gastric bypass History of left TKA Sinusitis  Discharged Condition: Stable  Significant Diagnostic Studies: DG Chest 2 View  Result Date: 12/24/2019 CLINICAL DATA:  Cough EXAM: CHEST - 2 VIEW COMPARISON:  11/29/2019 FINDINGS: Low lung volumes. Faint subpleural/peripheral pulmonary opacities in the lungs bilaterally with increased interstitial prominence. This appearance is chronic on prior studies and favors chronic interstitial lung disease. A superimposed acute process is difficult to exclude but is not favored. No pleural effusion or pneumothorax. The heart is top-normal in size. IMPRESSION: Low lung volumes with suspected chronic interstitial lung disease. Superimposed acute process is difficult to exclude but is not favored. Electronically Signed   By: Charline Bills M.D.   On: 12/24/2019 12:02   DG CHEST PORT 1 VIEW  Result Date: 11/29/2019 CLINICAL DATA:  Respiratory failure.  Hypoxia.  Cough. EXAM: PORTABLE CHEST 1 VIEW COMPARISON:  Chest x-rays 11/19/2019, 11/10/2019, and 11/06/2019 FINDINGS: Heart is mildly enlarged, exaggerated by low lung volumes. Similar appearance of interstitial opacities is present. No significant consolidation is present. No pleural effusion or pneumothorax is present. IMPRESSION: 1. Borderline cardiomegaly without failure. 2. Stable interstitial opacities. While this may be chronic, mild edema or infection is also considered. Electronically Signed   By: Marin Roberts M.D.   On: 11/29/2019 07:09   ECHOCARDIOGRAM COMPLETE  Result Date: 12/11/2019    ECHOCARDIOGRAM REPORT   Patient Name:    Bonnie Ballard Date of Exam: 11/29/2019 Medical Rec #:  [id number]  Height: Accession #:    [id number] Weight: Date of Birth:  06-21-1965  BSA: Patient Age:    53 years   BP:           138/70 mmHg Patient Gender: F          HR:           79 bpm. Exam Location:  Inpatient Procedure: 2D Echo, Cardiac Doppler and Color Doppler Indications:     CHF  History:         Patient has no prior history of Echocardiogram examinations.                  Signs/Symptoms:Shortness of Breath; Risk Factors:Obesity.                  Covid.  Sonographer:     Lavenia Atlas RDCS Referring Phys:  [id number] ALI HIJAZI Diagnosing Phys: Orpah Cobb MD  Sonographer Comments: Patient is morbidly obese. Image acquisition challenging due to patient body habitus. IMPRESSIONS  1. Left ventricular ejection fraction, by estimation, is 60 to 65%. The left ventricle has normal function. The left ventricle has no regional wall motion abnormalities. There is mild concentric left ventricular hypertrophy of the basal segment. Left ventricular diastolic parameters are consistent with Grade I diastolic dysfunction (impaired relaxation).  2. Right ventricular systolic function is normal. The right ventricular size is normal.  3. The mitral valve is normal in structure. Trivial mitral valve regurgitation.  4. The aortic valve is tricuspid. Aortic valve regurgitation is not visualized. Mild aortic valve sclerosis is present, with no evidence of aortic valve stenosis. FINDINGS  Left Ventricle: Left ventricular ejection fraction, by estimation, is 60 to 65%. The left  ventricle has normal function. The left ventricle has no regional wall motion abnormalities. The left ventricular internal cavity size was normal in size. There is  mild concentric left ventricular hypertrophy of the basal segment. Left ventricular diastolic parameters are consistent with Grade I diastolic dysfunction (impaired relaxation). Right Ventricle: The right ventricular size is normal. No increase  in right ventricular wall thickness. Right ventricular systolic function is normal. Left Atrium: Left atrial size was normal in size. Right Atrium: Right atrial size was normal in size. Pericardium: There is no evidence of pericardial effusion. Mitral Valve: The mitral valve is normal in structure. Trivial mitral valve regurgitation. Tricuspid Valve: The tricuspid valve is normal in structure. Tricuspid valve regurgitation is trivial. Aortic Valve: The aortic valve is tricuspid. Aortic valve regurgitation is not visualized. Mild aortic valve sclerosis is present, with no evidence of aortic valve stenosis. Pulmonic Valve: The pulmonic valve was normal in structure. Pulmonic valve regurgitation is not visualized. Aorta: The aortic root is normal in size and structure. There is minimal (Grade I) atheroma plaque involving the ascending aorta and aortic root. Venous: The inferior vena cava was not well visualized. IAS/Shunts: The interatrial septum was not assessed.  LEFT VENTRICLE PLAX 2D LVIDd:         3.90 cm  Diastology LVIDs:         2.50 cm  LV e' lateral:   5.66 cm/s LV PW:         1.20 cm  LV E/e' lateral: 9.1 LV IVS:        1.20 cm  LV e' medial:    5.55 cm/s LVOT diam:     2.00 cm  LV E/e' medial:  9.3 LV SV:         37 LVOT Area:     3.14 cm  RIGHT VENTRICLE RV Basal diam:  2.60 cm RV S prime:     6.96 cm/s TAPSE (M-mode): 1.8 cm LEFT ATRIUM           RIGHT ATRIUM LA diam:      2.80 cm RA Area:     10.20 cm LA Vol (A4C): 26.0 ml RA Volume:   20.20 ml  AORTIC VALVE LVOT Vmax:   62.70 cm/s LVOT Vmean:  48.500 cm/s LVOT VTI:    0.119 m  AORTA Ao Root diam: 3.10 cm MITRAL VALVE MV Area (PHT): 4.60 cm    SHUNTS MV Decel Time: 165 msec    Systemic VTI:  0.12 m MV E velocity: 51.40 cm/s  Systemic Diam: 2.00 cm MV A velocity: 45.00 cm/s MV E/A ratio:  1.14 Orpah Cobb MD Electronically signed by Orpah Cobb MD Signature Date/Time: 12/11/2019/8:45:24 AM    Final    CT MAXILLOFACIAL WO CONTRAST  Result Date:  12/20/2019 CLINICAL DATA:  Maxillofacial pain EXAM: CT MAXILLOFACIAL WITHOUT CONTRAST TECHNIQUE: Multidetector CT imaging of the maxillofacial structures was performed. Multiplanar CT image reconstructions were also generated. COMPARISON:  None. FINDINGS: Paranasal sinuses: Frontal: Normally aerated. Patent frontal sinus drainage pathways. Ethmoid: Mild ethmoid sinus mucosal thickening. Maxillary: Mild bilateral maxillary sinus mucosal thickening with layering small volume secretions. Sequela of bilateral antrostomies. Bilateral ostia are patent. Sphenoid: Minimal mucosal thickening. Patent sphenoethmoidal recesses. Nasal passages: Patent. Intact nasal septum is midline. Indwelling nasal cannula. Anatomy: No pneumatization superior to anterior ethmoid notches. Symmetric and intact olfactory grooves and fovea ethmoidalis, Keros II (4-62mm) Sellar sphenoid pneumatization pattern. No dehiscence of carotid or optic canals. No onodi cell. Other: Orbits and intracranial compartment are  unremarkable. Visible mastoid air cells are normally aerated. Small left petrous apex effusion. IMPRESSION: Mild ethmoid, sphenoid and maxillary sinus disease. Clear sinonasal drainage pathways. Layering maxillary sinus secretions may reflect active inflammation. Sequela of prior maxillary antrostomies. Electronically Signed   By: Stana Bunting M.D.   On: 12/20/2019 12:14    Labs:  Basic Metabolic Panel: No results for input(s): NA, K, CL, CO2, GLUCOSE, BUN, CREATININE, CALCIUM, MG, PHOS in the last 168 hours.  CBC: No results for input(s): WBC, NEUTROABS, HGB, HCT, MCV, PLT in the last 168 hours.  CBG: No results for input(s): GLUCAP in the last 168 hours.  Family history.  Mother and father with hypertension as well as hyperlipidemia.  Denies any colon cancer rectal cancer or esophageal cancer  Brief HPI:   Shaylinn Hladik is a 54 y.o. right-handed female with history of gastric bypass 2017, hypertension, sarcoidosis,  hyperlipidemia, hypothyroidism, left partial knee replacement April 2021.  Per chart review lives with 2 adult aged children in [place] city.  Daughter assists as needed.  Two-level home with 2 steps to entry.  Reportedly independent prior to admission.  Presented to outside hospital 09/23/2019 [facility] with COVID-19 pneumonia developed ARDS requiring BiPAP narrowly avoiding intubation and maintained on Solu-Medrol.  She was weaned down to high flow oxygen.  Hospital course complicated by bilateral DVTs and chest CT negative for pulmonary emboli.  She was placed on Eliquis.  Hospital course bouts of insomnia maintained on melatonin as well as anxiety with Klonopin.  Tolerating a regular diet.  Patient was admitted for a comprehensive rehab program   Hospital Course: Elida Harbin was admitted to rehab 12/04/2019 for inpatient therapies to consist of PT, ST and OT at least three hours five days a week. Past admission physiatrist, therapy team and rehab RN have worked together to provide customized collaborative inpatient rehab.  Pertaining to patient's critical illness myopathy related to COVID-19.  Prednisone taper as advised she was weaned from oxygen therapy to nasal cannula.  Hospital course bilateral lower extremity DVTs maintained on Eliquis no bleeding episodes.  Pain management use of hydrocodone.  Bouts of insomnia anxiety she continued on low-dose Klonopin.  Lipitor for hyperlipidemia.  Hormone supplement for hypothyroidism.  Blood pressure controlled Lopressor 50 mg twice daily no chest pain or increasing shortness of breath.  She is monitor for any signs of fluid overload she continued on Lasix therapy.  Patient did develop some acute sinusitis with maxillary sinus tenderness maxillofacial CT of sinuses demonstrated some fluid in maxillary sinuses as well as postop changes she was started on Augmentin.   Blood pressures were monitored on TID basis and controlled     Rehab  course: During patient's stay in rehab weekly team conferences were held to monitor patient's progress, set goals and discuss barriers to discharge. At admission, patient required mod max assist for ADLs mod max assist bed mobility with decreased standing balance  Physical exam.  Blood pressure 128/60 pulse 88 temperature 98 respirations 18 oxygen saturations 92% with nasal cannula oxygen HEENT Head.  Normocephalic and atraumatic Eyes.  Pupils round and reactive to light no discharge.nystagmus Neck.  Supple nontender no JVD without thyromegaly Cardiac regular rate rhythm without any extra sounds or murmur heard Respiratory good air movement coarse breath sounds but mild throughout no wheezes Abdomen.  Soft nontender positive bowel sounds without rebound Musculoskeletal normal range of motion no rigidity Comments.  Upper extremities 5 -/5 in deltoids biceps triceps wrist extension grip and  finger abduction bilaterally Lower extremities hip flexion 2+/5 knee extension 3+/5 dorsi and plantar flexion 4+/5 bilaterally Skin.  Scar to left knee   Francis Dowse/She  has had improvement in activity tolerance, balance, postural control as well as ability to compensate for deficits. Francis Dowse/She has had improvement in functional use RUE/LUE  and RLE/LLE as well as improvement in awareness.  Working with energy conservation techniques.  Perform supine to sit modified independent.  Was able to set up sliding board on level service perform sliding board transfers with supervision.  Propelled her wheelchair with supervision.  Perform lateral leans to pull up pants over hips required minimal assist from PTA to complete.  Sliding board transfers wheelchair to and from drop arm commode with set up and contact-guard assist.  She was able to manage her pants down with lateral weight shift incontinent of urine.  Assisted tub shower and practice sliding board transfers to and from tub bench with contact-guard assist.  Full family teaching  completed plan discharge to home       Disposition: Discharge disposition: 01-Home or Self Care     Discharge to home   Diet: Regular  Special Instructions: No driving smoking or alcohol  Continue oxygen therapy as directed  Recommend follow-up pulmonary services [place]  Medications at discharge 1.  Tylenol as needed 2.  Eliquis 5 mg p.o. twice daily 3.  Lipitor 40 mg p.o. daily 4.  Tessalon capsule 200 mg p.o. 3 times daily as needed cough 5.  Klonopin 0.25 mg p.o. twice daily x1 week then daily x1 week and stop 6.  Colace 100 mg p.o. BID if needed 7.  Lasix 40 mg p.o. daily 8.  Robitussin every 6 hours as needed cough 9.  Hydrocodone 1 tablet every 4 hours as needed pain 10.  Synthroid 25 mcg p.o. 3 times weekly Monday Wednesday Friday 11.  Melatonin 5 mg p.o. nightly 12.  Lopressor 50 mg p.o. twice daily 13.  Singulair 10 mg p.o. nightly 14.  Multivitamin daily 15.  Protonix 40 mg p.o. daily 16.  Paxil 37.5 mg p.o. daily 17.  Prednisone 50 mg daily taper as directed 18.  Albuterol inhaler as needed   30-35 minutes were spent completing discharge summary and discharge planning    Follow-up Information    Kirsteins, Victorino SparrowAndrew E, MD Follow up.   Specialty: Physical Medicine and Rehabilitation Why: No follow needed Contact information: [address] [place] [place] [address] [phone]               Signed: Mcarthur RossettiDaniel J Veronica Guerrant 12/28/2019, 5:08 AM

## 2019-12-26 NOTE — Patient Care Conference (Signed)
Inpatient RehabilitationTeam Conference and Plan of Care Update Date: 12/26/2019   Time:   10:10 AM   Patient Name: Bonnie Ballard      Medical Record Number: [id number]  Date of Birth: [birth year] Sex: Female         Room/Bed: 4W16C/4W16C-01 Payor Info: Payor: MEDICARE / Plan: MEDICARE PART A AND B / Product Type: *No Product type* /    Admit Date/Time:  12/04/2019  3:20 PM  Primary Diagnosis:  Intensive care (ICU) myopathy  Hospital Problems: Principal Problem:   Intensive care (ICU) myopathy Active Problems:   Pneumonia due to COVID-19 virus   Critical illness myopathy    Expected Discharge Date: Expected Discharge Date: 12/28/19  Team Members Present: Physician leading conference: Dr. Claudette Laws Care Coodinator Present: Chana Bode, RN, BSN, CRRN;Christina Vita Barley, BSW Nurse Present: Mauro Kaufmann, LPN PT Present: Grier Rocher, PT OT Present: Jake Shark, OT PPS Coordinator present : Edson Snowball, Park Breed, SLP     Current Status/Progress Goal Weekly Team Focus  Bowel/Bladder             Swallow/Nutrition/ Hydration             ADL's   Set-up UB BADLs, (S) LB BADLs at bed level with rolling R<>L.Dellis Filbert A toileting tasks. Still with poor activity tolerance  Min A overall  Shower transfers, family education, activity tolerance, functional transfers.   Mobility   supervision bed mobility, assist with set up for SB only, SB transfer performed SBA, continues to demonstrate significant weakness in glutes limiting power to stand, continues to demonstrate poor tolerance to activity/quick to fatigue, can tolerate short bouts of 4L supplemental O2 but desat with extended time  supervision bed mobility, minA transfers and minA short distance ambulation  BLE strengthening, family ed   Communication             Safety/Cognition/ Behavioral Observations            Pain             Skin               Discharge Planning:  Patient goal to discharge home.  Daughters in home to asisst   Team Discussion: MD reported patient did not tolerate prednisone taper and will discharge on prednisone. Muco-mist for breathing however patient does not like this option for treatment of lingering cough and prefers tessalon pearls with flutter valve. Respiratory status impaired by pulmonary fibrosis, post COVID damage to lungs and sarcoidosis. Progress impaired by poor activity tolerance and anxiety. Continue oxygen supplementation. Patient on target to meet rehab goals: yes  *See Care Plan and progress notes for long and short-term goals.   Revisions to Treatment Plan:  Home with oxygen and prednisone. Teaching Needs: Transfers, toileting as patient will note be ambulatory at discharge in the home  Current Barriers to Discharge: New oxygen and transfers  Possible Resolutions to Barriers: Practice car transfers Family education set for 12/26/19 Home concentrator ordered with [facility] follow up     Medical Summary Current Status: trial of mucomyst, on abx for sinusitis  Barriers to Discharge: Medical stability   Possible Resolutions to Barriers/Weekly Focus: slowed down prednisone wean, augmentin for sinusitis   Continued Need for Acute Rehabilitation Level of Care: The patient requires daily medical management by a physician with specialized training in physical medicine and rehabilitation for the following reasons: Direction of a multidisciplinary physical rehabilitation program to maximize functional independence : Yes Medical management of patient  stability for increased activity during participation in an intensive rehabilitation regime.: Yes Analysis of laboratory values and/or radiology reports with any subsequent need for medication adjustment and/or medical intervention. : Yes   I attest that I was present, lead the team conference, and concur with the assessment and plan of the team.   Chana Bode B 12/26/2019, 4:18 PM

## 2019-12-26 NOTE — Progress Notes (Signed)
Recreational Therapy Session Note  Patient Details  Name: Bonnie Ballard MRN: [id number] Date of Birth: [birth year] Today's Date: 12/26/2019  Pain: no c/o Skilled Therapeutic Interventions/Progress Updates: Pts daughter present and team members requesting demonstration and practice of slide board transfers.  Session focused on family education during cotreat with OT.  Pt's daughter present observing simulated car transfer to 27" seat height.  Pt required multiple stops during slide board tranfer due to low activity tolerance/exertion for uphill transfer.  Pt required extended rest break once on car seat.  Returned to w/c via slide board with contact guard assist.  Verbal cues/education on slide board education provided to pt and daughter.   Pt also practiced slide board transfer to the couch in the ADL apartment with min assist downhill to the couch and max assist +2 to return to the w/c. Discussed importance of surface height and compensatory strategies/techniques to facilitate easier transfers. Both stated understanding. Therapy/Group: Co-Treatment  Amairani Shuey 12/26/2019, 3:28 PM

## 2019-12-26 NOTE — Progress Notes (Addendum)
Physical Therapy Session Note  Patient Details  Name: Bonnie Ballard MRN: [id number] Date of Birth: [birth year]  Today's Date: 12/26/2019 PT Individual Time: 1511-1540 PT Individual Time Calculation (min): 29 min   Short Term Goals: Week 3:  PT Short Term Goal 1 (Week 3): STG=LTG due to ELOS  Skilled Therapeutic Interventions/Progress Updates:  Pt received in room with visitor present with mask off & eating - PT educates her on masking policy & pulls mask up but below nose. Pt agreeable to tx but declines practicing car transfer again noting significant fatigue. Pt reports she will have family park beside curb if possible to reduce height between w/c & car seat. Provided pt with energy conservation handouts & reviewed energy conservation methods & how they apply to pt. Pt performs BLE long arc quads & seated marches, 2 sets x 15 reps with 2.5# ankle weights for BLE strengthening. At end of session pt left in w/c with call bell & all needs in reach, visitor present in room.  Pt voices she's nervous about going home on oxygen but states she has a pulse oximeter. PT educates her on SpO2 > or = 90% but encourages her to speak with MD re: this prior to d/c.  Therapy Documentation Precautions:  Precautions Precautions: Fall, Other (comment) Precaution Comments: 5-7L via [place] Restrictions Weight Bearing Restrictions: No  Pain: 6/10 B knees & back (chronic pain) - rest breaks provided PRN, nurse administered pain meds   Therapy/Group: Individual Therapy  Sandi Mariscal 12/26/2019, 3:51 PM

## 2019-12-26 NOTE — Progress Notes (Signed)
Occupational Therapy Session Note  Patient Details  Name: Bonnie Ballard MRN: [id number] Date of Birth: 01-06-1966  Today's Date: 12/26/2019 OT Individual Time: 0909-1000 OT Individual Time Calculation (min): 51 min    Short Term Goals: Week 3:  OT Short Term Goal 1 (Week 3): Patient will complete LB bathing seated EOB with Min A and lateral leans. OT Short Term Goal 2 (Week 3): Patient will complete 3/3 parts of toileting task seated on BSC. OT Short Term Goal 3 (Week 3): Patient will complete tub/shower transfer to TTB in prep for bathing task.  Skilled Therapeutic Interventions/Progress Updates:    Pt received in w/c with c/o aching pain 7/10 in her back and knees. Pt's daughter did not come for scheduled family education session. Pt completed oral care and grooming tasks at the sink with set up assist. Pt on 7L O2 via [place] in w/c, pt at 98% SpO2 and agreeable to come down to 4 L at rest. Pt was taken to the ADL apt for tub transfer. Pt increased to 6L via [place]. Pt completed slideboard transfer with min A to the TTB. She required extended rest break while seated. Discussed d/c planning throughout session,as well as energy conservation strategies. Pt completed transfer back to her w/c with (S). Discussed set up of equipment and how to instruct daughter/family to help. Pt required another seated rest break following transfer. Edu on use of pulse oximeter at home and monitoring of SpO2. Pt was taken back to her room via w/c. She completed 2x10 repetitions of BLE leg extension and hip adduction squeezes from seated with 1.5 lb ankle weights. Pt was left sitting up in her w/c with all needs met.   Therapy Documentation Precautions:  Precautions Precautions: Fall, Other (comment) Precaution Comments: 5-7L via [place] Restrictions Weight Bearing Restrictions: No  Therapy/Group: Individual Therapy  Curtis Sites 12/26/2019, 6:50 AM

## 2019-12-26 NOTE — Progress Notes (Signed)
Occupational Therapy Session Note  Patient Details  Name: Bonnie Ballard MRN: [id number] Date of Birth: [birth year]  Today's Date: 12/26/2019 OT Individual Time: 1300-1345 OT Individual Time Calculation (min): 45 min    Short Term Goals: Week 3:  OT Short Term Goal 1 (Week 3): Patient will complete LB bathing seated EOB with Min A and lateral leans. OT Short Term Goal 2 (Week 3): Patient will complete 3/3 parts of toileting task seated on BSC. OT Short Term Goal 3 (Week 3): Patient will complete tub/shower transfer to TTB in prep for bathing task.  Skilled Therapeutic Interventions/Progress Updates:    Cotreatment with Recreational Therapist. Daughter present for education. Pt's daughter observed throughout session but did not actively participate.  Simulated SB car transfer to 27" height. Pt able to position w/c in proper position and initiate placement of SB. Transfer with min A with 3 "stops" to rest. O2 sats>94% on 5L O2. Pt required extended rest break after transfer. SB transfer to w/c with CGA. Pt also practiced SB transfer to sofa in ADL apartment. Min A to sofa and max A+2 back to w/c. Discussed importance of surface height and compensatory strategies/techniques to facilitate easier transfers. Pt returned to room and remained seated in w/c with daughter present.   Therapy Documentation Precautions:  Precautions Precautions: Fall, Other (comment) Precaution Comments: 5-7L via [place] Restrictions Weight Bearing Restrictions: No  Pain:  Pt denies pain this afternoon   Therapy/Group: Individual Therapy  Rich Brave 12/26/2019, 2:42 PM

## 2019-12-26 NOTE — Plan of Care (Signed)
°  Problem: RH Ambulation Goal: LTG Patient will ambulate in home environment (PT) Description: LTG: Patient will ambulate in home environment, # of feet with assistance (PT). Outcome: Not Applicable Flowsheets (Taken 12/26/2019 1012) LTG: Pt will ambulate in home environ  assist needed:: (goal d/c due to continue weakness) -- Note: Goal d/c due to lack of progress.    Problem: RH Ambulation Goal: LTG Patient will ambulate in controlled environment (PT) Description: LTG: Patient will ambulate in a controlled environment, # of feet with assistance (PT). Flowsheets (Taken 12/26/2019 1012) LTG: Pt will ambulate in controlled environ  assist needed:: Moderate Assistance - Patient 50 - 74% LTG: Ambulation distance in controlled environment: 6ft in parallel bars to improve WB and prepare for improve independence with gait Note: Down graded due to lack of progress.

## 2019-12-27 ENCOUNTER — Inpatient Hospital Stay (HOSPITAL_COMMUNITY): Payer: Medicare Other | Admitting: Physical Therapy

## 2019-12-27 ENCOUNTER — Inpatient Hospital Stay (HOSPITAL_COMMUNITY): Payer: Medicare Other | Admitting: Occupational Therapy

## 2019-12-27 MED ORDER — DOCUSATE SODIUM 100 MG PO CAPS: 100.0000 mg | ORAL_CAPSULE | Freq: Three times a day (TID) | ORAL | 0 refills | Status: DC

## 2019-12-27 MED ORDER — APIXABAN 5 MG PO TABS: 5.0000 mg | ORAL_TABLET | Freq: Two times a day (BID) | ORAL | 0 refills | Status: AC

## 2019-12-27 MED ORDER — POLYETHYLENE GLYCOL 3350 17 G PO PACK: 17.0000 g | PACK | Freq: Every day | ORAL | 0 refills | Status: DC

## 2019-12-27 MED ORDER — METOPROLOL TARTRATE 50 MG PO TABS: 50.0000 mg | ORAL_TABLET | Freq: Two times a day (BID) | ORAL | 0 refills | Status: AC

## 2019-12-27 MED ORDER — CALCIUM CARBONATE ANTACID 500 MG PO CHEW: 1.0000 | CHEWABLE_TABLET | Freq: Three times a day (TID) | ORAL | Status: AC | PRN

## 2019-12-27 MED ORDER — FUROSEMIDE 40 MG PO TABS: 40.0000 mg | ORAL_TABLET | Freq: Every day | ORAL | 0 refills | Status: AC

## 2019-12-27 MED ORDER — ATORVASTATIN CALCIUM 40 MG PO TABS: 40.0000 mg | ORAL_TABLET | Freq: Every day | ORAL | 0 refills | Status: AC

## 2019-12-27 MED ORDER — HYDROCODONE-ACETAMINOPHEN 5-325 MG PO TABS
1.0000 | ORAL_TABLET | ORAL | 0 refills | Status: AC | PRN
Start: 2019-12-27 — End: ?

## 2019-12-27 MED ORDER — PAROXETINE HCL ER 37.5 MG PO TB24: 37.5000 mg | ORAL_TABLET | Freq: Every day | ORAL | 0 refills | Status: AC

## 2019-12-27 MED ORDER — VITAMIN D 25 MCG (1000 UNIT) PO TABS: 1000.0000 [IU] | ORAL_TABLET | Freq: Every day | ORAL | 0 refills | Status: AC

## 2019-12-27 MED ORDER — CLONAZEPAM 0.25 MG PO TBDP
0.2500 mg | ORAL_TABLET | Freq: Three times a day (TID) | ORAL | Status: DC
  Administered 2019-12-27 – 2019-12-28 (×4): 0.25 mg via ORAL
  Filled 2019-12-27 (×4): qty 1

## 2019-12-27 MED ORDER — ZINC GLUCONATE 50 MG PO TABS: 50.0000 mg | ORAL_TABLET | Freq: Every day | ORAL | 0 refills | Status: AC

## 2019-12-27 MED ORDER — MONTELUKAST SODIUM 10 MG PO TABS: 10.0000 mg | ORAL_TABLET | Freq: Every day | ORAL | 0 refills | Status: AC

## 2019-12-27 MED ORDER — OMEPRAZOLE 40 MG PO CPDR: 40.0000 mg | DELAYED_RELEASE_CAPSULE | Freq: Every day | ORAL | 0 refills | Status: AC

## 2019-12-27 MED ORDER — LEVOTHYROXINE SODIUM 25 MCG PO TABS: 25.0000 ug | ORAL_TABLET | ORAL | 0 refills | Status: AC

## 2019-12-27 MED ORDER — CLONAZEPAM 0.5 MG PO TABS: 0.5000 mg | ORAL_TABLET | Freq: Three times a day (TID) | ORAL | 0 refills | Status: DC

## 2019-12-27 MED ORDER — BENZONATATE 200 MG PO CAPS: 200.0000 mg | ORAL_CAPSULE | Freq: Three times a day (TID) | ORAL | 0 refills | Status: AC | PRN

## 2019-12-27 MED ORDER — ACETAMINOPHEN 325 MG PO TABS: 650.0000 mg | ORAL_TABLET | Freq: Four times a day (QID) | ORAL | Status: AC | PRN

## 2019-12-27 MED ORDER — CLONAZEPAM 0.25 MG PO TBDP: 0.2500 mg | ORAL_TABLET | Freq: Three times a day (TID) | ORAL | 0 refills | Status: DC

## 2019-12-27 MED ORDER — ALBUTEROL SULFATE (2.5 MG/3ML) 0.083% IN NEBU: 3.0000 mL | INHALATION_SOLUTION | RESPIRATORY_TRACT | 12 refills | Status: DC | PRN

## 2019-12-27 MED ORDER — MELATONIN 5 MG PO TABS: 5.0000 mg | ORAL_TABLET | Freq: Every day | ORAL | 0 refills | Status: AC

## 2019-12-27 MED ORDER — PREDNISONE 5 MG PO TABS: ORAL_TABLET | ORAL | 0 refills | Status: AC

## 2019-12-27 MED FILL — LEVOTHYROXINE SODIUM 25 MCG: 25 | 30 days supply | Qty: 13 | Fill #0

## 2019-12-27 MED FILL — OMEPRAZOLE 40 MG CPDR: 40 | 30 days supply | Qty: 30 | Fill #0

## 2019-12-27 MED FILL — BENZONATATE 200 MG CAPS: 200 | 6 days supply | Qty: 20 | Fill #0

## 2019-12-27 MED FILL — ATORVASTATIN CALCIUM 40 MG: 40 | 30 days supply | Qty: 30 | Fill #0

## 2019-12-27 MED FILL — HYDROCODON-APAP 5-325: 5-325 | 5 days supply | Qty: 30 | Fill #0

## 2019-12-27 MED FILL — VITAMIN D3 25 MCG TABS: 25 | 30 days supply | Qty: 30 | Fill #0

## 2019-12-27 MED FILL — predniSONE 5 MG TABS: 5 | 21 days supply | Qty: 42 | Fill #0

## 2019-12-27 MED FILL — MONTELUKAST SOD 10 MG TAB: 10 | 30 days supply | Qty: 30 | Fill #0

## 2019-12-27 MED FILL — PAROXETINE ER 37.5 MG TAB: 37.5 | 30 days supply | Qty: 30 | Fill #0

## 2019-12-27 MED FILL — MELATONIN 5 MG TABS: 5 | 30 days supply | Qty: 30 | Fill #0

## 2019-12-27 MED FILL — ELIQUIS 5 MG TABLET: 5 | 30 days supply | Qty: 60 | Fill #0

## 2019-12-27 MED FILL — ZINC SULFATE 220 MG TABLET: 220 (50 ZN) | 30 days supply | Qty: 30 | Fill #0

## 2019-12-27 MED FILL — METOPROLOL TARTRATE 50 MG T: 50 | 30 days supply | Qty: 60 | Fill #0

## 2019-12-27 MED FILL — FUROSEMIDE 40 MG TABLET: 40 | 30 days supply | Qty: 30 | Fill #0

## 2019-12-27 NOTE — Progress Notes (Signed)
Physical Therapy Discharge Summary  Patient Details  Name: Bonnie Ballard MRN: [id number] Date of Birth: 03/12/1966  Today's Date: 12/28/2019 PT Individual Time: 1105-1200 PT Individual Time Calculation (min): 55 min    Patient has met 12 of 12 long term goals due to improved activity tolerance, improved balance, improved postural control, increased strength, increased range of motion, ability to compensate for deficits and functional use of  right lower extremity and left lower extremity.  Patient to discharge at a wheelchair level Supervision.   Patient's care partner is independent to provide the necessary physical assistance at discharge.  Reasons goals not met: All PT goals met   Recommendation:  Patient will benefit from ongoing skilled PT services in home health setting to continue to advance safe functional mobility, address ongoing impairments in safety, strength, gait, tranfers, and minimize fall risk.  Equipment: WC. SB.   Reasons for discharge: treatment goals met and discharge from hospital  Patient/family agrees with progress made and goals achieved: Yes   PT treatment:  Pt received supine in bed and agreeable to PT. Supine>sit transfer without assist and or cues. PT demonstrated use of SB to transfer to and from [facility] with SB. SB transfer to [place]. Pt educated on home equipment and safe use of O2 for car ride home. Pt transported to entrance of hospital. Sb transfer to small SUV with min assist and SB. Min cues for SB placement and WC parts management. Pt left sitting in car for d/c home from hospital.    PT Discharge Precautions/Restrictions Precautions Precautions: Fall;Other (comment) Precaution Comments: 5-6L via [place] Restrictions Weight Bearing Restrictions: No Vital Signs   Pain Pain Assessment Pain Scale: 0-10 Pain Score: 0-No pain Vision/Perception  Perception Perception: Within Functional Limits Praxis Praxis: Intact  Cognition Overall Cognitive Status:  Within Functional Limits for tasks assessed Arousal/Alertness: Awake/alert Orientation Level: Oriented X4 Focused Attention: Appears intact Memory: Appears intact Sensation Sensation Light Touch: Appears Intact Additional Comments: mild parathesia in the L knee at TKA insicion Coordination Gross Motor Movements are Fluid and Coordinated: Yes Fine Motor Movements are Fluid and Coordinated: Yes Heel Shin Test: near [facility], mild ROM deficits on the R. Motor  Motor Motor: Other (comment) Motor - Discharge Observations: critical careness myopathy from prolonged hospitalization. greatly improved from Eval  Mobility Bed Mobility Bed Mobility: Rolling Right;Rolling Left;Sit to Supine;Supine to Sit Rolling Right: Independent with assistive device Rolling Left: Independent with assistive device Supine to Sit: Independent with assistive device Sit to Supine: Independent with assistive device Transfers Transfers: Sit to Stand;Stand to Sit;Lateral/Scoot Transfers Sit to Stand: Minimal Assistance - Patient > 75% Stand to Sit: Minimal Assistance - Patient > 75% (elevated mat table and BUE support on RW) Lateral/Scoot Transfers: Set up assist Transfer (Assistive device): Other (Comment) Contractor) Water quality scientist Assistance: 2 Helpers Gait Distance (Feet): 4 Feet Assistive device: Rolling walker Gait Assistance Details: Verbal cues for technique;Verbal cues for precautions/safety Gait Assistance Details: 4 ft in maxi sky with WC following. min assist until pt required seated rest break Gait Gait: Yes Gait Pattern: Impaired Gait Pattern: Lateral hip instability;Left flexed knee in stance;Right flexed knee in stance Stairs / Additional Locomotion Stairs: No Wheelchair Mobility Wheelchair Mobility: Yes Wheelchair Assistance: Independent with Camera operator: Both upper extremities Wheelchair Parts Management: Independent Distance: 154f  Trunk/Postural  Assessment  Cervical Assessment Cervical Assessment: Within Functional Limits Thoracic Assessment Thoracic Assessment: Within Functional Limits Lumbar Assessment Lumbar Assessment: Within Functional Limits Postural Control Postural Control: Within  Functional Limits  Balance Balance Balance Assessed: Yes Static Sitting Balance Static Sitting - Balance Support: Feet supported Static Sitting - Level of Assistance: 7: Independent Dynamic Sitting Balance Dynamic Sitting - Balance Support: Feet supported Dynamic Sitting - Level of Assistance: 7: Independent Static Standing Balance Static Standing - Balance Support: Bilateral upper extremity supported Static Standing - Level of Assistance: 4: Min assist Static Standing - Comment/# of Minutes: able to tolerate 20sec in standing. Dynamic Standing Balance Dynamic Standing - Balance Support: Right upper extremity supported Dynamic Standing - Level of Assistance: 3: Mod assist;2: Max assist Dynamic Standing - Comments: initiation of gait training Extremity Assessment  RUE Assessment RUE Assessment: Within Functional Limits General Strength Comments: MMT grossly 4+/5 LUE Assessment LUE Assessment: Within Functional Limits General Strength Comments: MMT grossly 4+/5 RLE Assessment RLE Assessment: Exceptions to [facility] General Strength Comments: proximal to distal grossly 4/5 LLE Assessment LLE Assessment: Exceptions to [facility] General Strength Comments: grossly 4/5 proximal to distal    Lorie Phenix 12/27/2019, 2:25 PM

## 2019-12-27 NOTE — Progress Notes (Signed)
Recreational Therapy Session Note  Patient Details  Name: Bonnie Ballard MRN: [id number] Date of Birth: Nov 04, 1965 Today's Date: 12/27/2019  Pain: no c/o Skilled Therapeutic Interventions/Progress Updates: Session focused on discharge planning and community reintegration/education during co-treat with PT.  Education provided on community reintegration including ramp negotiation, locating curb cuts, accessing public restroom and general safety.  Pt propelled w/c up and down a ramp with min assist.  Pt provided with w/c gloves and dycem to use with slide board transfers.  Pt excited about discharge home tomorrow.  Therapy/Group: Co-Treatment Nakima Fluegge 12/27/2019, 3:29 PM

## 2019-12-27 NOTE — Progress Notes (Addendum)
[facility] PHYSICAL MEDICINE & REHABILITATION PROGRESS NOTE    Patient states she slept well, does not think Mucomyst is helping mobilize secretions.  Discussed Klonopin she is not on this at home.  We discussed starting to wean  ROS:    ROS- neg CP, SOB, N/V/D    Objective:   No results found. No results for input(s): WBC, HGB, HCT, PLT in the last 72 hours. No results for input(s): NA, K, CL, CO2, GLUCOSE, BUN, CREATININE, CALCIUM in the last 72 hours.  Intake/Output Summary (Last 24 hours) at 12/27/2019 0856 Last data filed at 12/26/2019 2030 Gross per 24 hour  Intake 840 ml  Output --  Net 840 ml     Physical Exam: Vital Signs Blood pressure 123/78, pulse 77, temperature 98 F (36.7 C), resp. rate 20, height 5\' 7"  (1.702 m), weight 103.4 kg, SpO2 97 %.    General: No acute distress Mood and affect are appropriate Heart: Regular rate and rhythm no rubs murmurs or extra sounds Lungs: Clear to auscultation, breathing unlabored, no rales or wheezes Abdomen: Positive bowel sounds, soft nontender to palpation, nondistended Extremities: No clubbing, cyanosis, or edema Skin: No evidence of breakdown, no evidence of rash  Neurologic: Cranial nerves II through XII intact, motor strength is 5/5 in bilateral deltoid, bicep, tricep, grip, hip flexor, knee extensors, ankle dorsiflexor and plantar flexor 3/5 Hip ext  Musculoskeletal: Full range of motion in all 4 extremities. No joint swelling  Assessment/Plan: 1. Functional deficits secondary to CIM which require 3+ hours per day of interdisciplinary therapy in a comprehensive inpatient rehab setting.  Physiatrist is providing close team supervision and 24 hour management of active medical problems listed below.  Physiatrist and rehab team continue to assess barriers to discharge/monitor patient progress toward functional and medical goals  Care Tool:  Bathing    Body parts bathed by patient: Front perineal area, Face, Right  arm, Left arm, Chest, Abdomen, Right upper leg, Left upper leg, Right lower leg, Left lower leg, Buttocks   Body parts bathed by helper: Front perineal area, Buttocks, Right upper leg, Left upper leg, Right lower leg, Left lower leg Body parts n/a: Right arm, Left arm, Chest, Abdomen   Bathing assist Assist Level: Supervision/Verbal cueing     Upper Body Dressing/Undressing Upper body dressing   What is the patient wearing?: Pull over shirt    Upper body assist Assist Level: Set up assist    Lower Body Dressing/Undressing Lower body dressing      What is the patient wearing?: Pants     Lower body assist Assist for lower body dressing: Supervision/Verbal cueing (Bed level)     Toileting Toileting    Toileting assist Assist for toileting: Minimal Assistance - Patient > 75%     Transfers Chair/bed transfer  Transfers assist     Chair/bed transfer assist level: Set up assist Chair/bed transfer assistive device: Sliding board   Locomotion Ambulation   Ambulation assist   Ambulation activity did not occur: Safety/medical concerns  Assist level: 2 helpers Assistive device: Parallel bars Max distance: 66ft   Walk 10 feet activity   Assist  Walk 10 feet activity did not occur: Safety/medical concerns  Assist level: 2 helpers Assistive device: Parallel bars   Walk 50 feet activity   Assist Walk 50 feet with 2 turns activity did not occur: Safety/medical concerns         Walk 150 feet activity   Assist Walk 150 feet activity did not occur:  Safety/medical concerns         Walk 10 feet on uneven surface  activity   Assist Walk 10 feet on uneven surfaces activity did not occur: Safety/medical concerns         Wheelchair     Assist Will patient use wheelchair at discharge?: Yes Type of Wheelchair: Manual    Wheelchair assist level: Supervision/Verbal cueing Max wheelchair distance: 150    Wheelchair 50 feet with 2 turns  activity    Assist        Assist Level: Supervision/Verbal cueing   Wheelchair 150 feet activity     Assist      Assist Level: Supervision/Verbal cueing, Minimal Assistance - Patient > 75%   Blood pressure 123/78, pulse 77, temperature 98 F (36.7 C), resp. rate 20, height 5\' 7"  (1.702 m), weight 103.4 kg, SpO2 97 %.  Medical Problem List and Plan: 1.  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  Continue prednisone with taper Currently 15mg  daily - add flutter valve  Discontinue mucomyst for post covid cough -Currently with 5 L oxygen nasal cannula             -patient may  shower             -ELOS/Goals:  Mod I to supervision 9/10, should be ready for discharge in a.m.  2.  Antithrombotics: -DVT/anticoagulation: Bilateral lower extremity DVTs.  Patient will remain on Eliquis 5 mg twice daily for a minimum of 3 months total             -antiplatelet therapy: N/A 3. Pain Management: Hydrocodone 1 tablet every 6 hours as needed- pt said she took Percocet 10/325 mg QID at home- Added heat , Kpad. Well controlled.   9/4- pain well controlled except for sinus pressure- con't regimen 4. Mood: Clonazepam 0.5 mg 3 times daily, reduced to 0.25 mg 3 times daily, would reduce to twice daily for 1 week then once a day for 1 week after discharge to home Paxil 40 mg daily, melatonin 6 mg nightly- will see if possible to wean  -would benefit from neuropsych referral to help deal with situational anxiety             -antipsychotic agents: N/A  5. Neuropsych: This patient is capable of making decisions on her own behalf. 6. Skin/Wound Care: Routine skin checks 7. Fluids/Electrolytes/Nutrition: Routine in and outs with follow-up chemistries. Added Boost BID as per patient's request 8.  Hypothyroidism.  Continue Synthroid 25 mcg Monday Wednesday Friday 9.  Hypertension.  Lasix 40 mg daily, metoprolol 50 mg twice daily Vitals:   12/26/19 2014 12/27/19 0557  BP: 115/79  123/78  Pulse: 93 77  Resp: 19 20  Temp: 97.9 F (36.6 C) 98 F (36.7 C)  SpO2: 97% 97%  Well controlled 10.  Hyperlipidemia.  Lipitor 40 mg daily 11.  History of gastric bypass 2017.  Dietary follow-up 12.  History left TKA April 2021.  Weightbearing as tolerated- left knee actually hurts more than RIght  And needs TKR on R knee, per pt. 13. Grounds pass if portable O2 is available  14.  Post infectious cough on Dulera, on Prednisone taper. Will slow down taper Recheck CXR           15.  Acute  sinusitis with max sinus tenderness maxillofacial CT of sinuses demonstrates some fluid in maxillary sinuses as well as post op changes, start Augmentin will finish tomorrow , discontinue Afrin spray  LOS: 23 days A FACE TO FACE EVALUATION WAS PERFORMED  Erick Colace 12/27/2019, 8:56 AM

## 2019-12-27 NOTE — Progress Notes (Signed)
Patient ID: Bonnie Ballard, female   DOB: 1966/01/18, 54 y.o.   MRN: [id number]   Patient 02 concentrator home set up delivered today.

## 2019-12-27 NOTE — Progress Notes (Signed)
Physical Therapy Session Note  Patient Details  Name: Bonnie Ballard MRN: [id number] Date of Birth: 03-Aug-1965  Today's Date: 12/27/2019 PT Individual Time: 0902-1000 and 1400-1515 PT Individual Time Calculation (min): 58 min and 75 min    Short Term Goals: Week 3:  PT Short Term Goal 1 (Week 3): STG=LTG due to ELOS  Skilled Therapeutic Interventions/Progress Updates:   Pt received sitting in WC and agreeable to PT. Pt performed WC mobility through [place] without cues or assist from PT x 174f. SB transfer to mat table with supervision assist and min cues for SB placement. Lateral scooting at EOB with increased time due to mild SOB, but no assist from PT, SpO2 monitored following lateral scoot, 92% on 6L/min. Pt performed sit<>stand from elevated mat height x 2 with min assist and increased time, no knee instability noted. Gait training with RW in maxisky walking sling x 4 ft with +2 assist for WC follow. Prolonged rest break required by pt due to severe SOB and fatigue. [facility] instructed pt in seated HEP with hand out provided: LAQ, hip abduction, ankle DF, ankle PF, leg press, reciprocal marching. Each performed x 8 BLE with cues for full ROM and decreased speed as well as use of level 2 tband to increase resistance and strengthening of therex. Pt left sitting in WC with call bell in reach and all needs met.    Session 2.   Pt received sitting in WC and agreeable to PT. WC mobility 2 x 1513fwithout assist. Pt noted to have increased SOB following WC mobility this afternoon requiring increased time for rest breaks following each bout of gait training. PT instructed pt in Grad day assessment to measure progress toward goals. See below for details. Sitting balance/endurnace training with [employer], lower R and L quadrant 3 and 4 rings x 2 each, 1 min bouts. Pt performed WC mobility up/down 1277famp with min assist initially then supervision assist to ascend/descend; cues for  anterior weight shift to prevent posterior tipping. Recreation therapist present throughout end of session with education provided for community access as well safety in home environment. Pt returned to room and performed SB transfer to bed with set up assist for SB placement. Sit>supine completed without assist, and left supine in bed with call bell in reach and all needs met.         Therapy Documentation Precautions:  Precautions Precautions: Fall, Other (comment) Precaution Comments: 5-6L via [place] Restrictions Weight Bearing Restrictions: No    Pain: Pain Assessment Pain Scale: 0-10 Pain Score: 0-No pain     Trunk/Postural Assessment : Cervical Assessment Cervical Assessment: Within Functional Limits Thoracic Assessment Thoracic Assessment: Within Functional Limits Lumbar Assessment Lumbar Assessment: Within Functional Limits Postural Control Postural Control: Within Functional Limits  Balance: Balance Balance Assessed: Yes Static Sitting Balance Static Sitting - Balance Support: Feet supported Static Sitting - Level of Assistance: 7: Independent Dynamic Sitting Balance Dynamic Sitting - Balance Support: Feet supported Dynamic Sitting - Level of Assistance: 7: Independent    Therapy/Group: Individual Therapy  AusLorie Phenix9/2021, 1:26 PM

## 2019-12-27 NOTE — Progress Notes (Addendum)
Occupational Therapy Discharge Summary  Patient Details  Name: Bonnie Ballard MRN: [id number] Date of Birth: April 21, 1965  Today's Date: 12/27/2019 OT Individual Time: 7591-6384 OT Individual Time Calculation (min): 57 min    Patient has met 8 of 8 long term goals due to improved activity tolerance, ability to compensate for deficits and improved cardiopulmonary endurance. Patient currently demonstrates supervision A for bathing tasks at EOB, independence with UB dressing and supervision A for LB dressing in supine 2/2 BLE generalized weakness. Patient completes toilet and shower tranfers with Min A using slideboard and 3/3 parts of toileting with Min A for hiking pants over hips seated on drop-arm BSC. Min A for sit to stand transfers from significantly elevated surfaces to bariatric RW with ability to maintain position for >30 seconds 2/2 fatigue. Patient to d/c home with family who is able to provide assist with BADLs/IADLs. Daughter attended family education session and has demonstrated ability to assist patient with functional transfers and self-care tasks.   Reasons goals not met: N/A Recommendation:  Patient will benefit from ongoing skilled OT services in home health setting to continue to advance functional skills in the area of BADL, iADL and Reduce care partner burden.  Equipment: Drop-arm BSC, TTB  Reasons for discharge: treatment goals met and discharge from hospital  Patient/family agrees with progress made and goals achieved: Yes   Skilled Therapeutic Interventions/Progress Updates:  Patient met seated in wc in agreement with OT treatment session with focus on cardiopulmonary endurance, d/c planning, and community reintegration as detailed below. Patient expressed concern about getting into her friends vehicle at d/c tomorrow. Patient in agreement with going to hospital entrance to see where she'll be picked up. Wc mobility from room to [employer] with 3 rest breaks 2/2  fatigue and SOB on 6L O2 via [place]. Patient able to complete SB transfer to bench at main hospital entrance with supervision A after placement of board. After rest break, patient completed "uphill" transfer back to wheelchair with assist to prevent board from sliding on bench surface. OT provided education on HEP until initiation of [place] therapies with patient expressing verbal understanding. Total A for wc transport back to room for energy conservation and time management. Session concluded with patient seated in wc with call bell within reach and all needs met.   OT Discharge Precautions/Restrictions  Precautions Precautions: Fall;Other (comment) Precaution Comments: 5-6L via [place] Restrictions Weight Bearing Restrictions: No Pain Pain Assessment Pain Scale: 0-10 Pain Score: 0-No pain ADL ADL Eating: Independent Where Assessed-Eating: Wheelchair Grooming: Independent Where Assessed-Grooming: Sitting at sink Upper Body Bathing: Setup Where Assessed-Upper Body Bathing: Edge of bed Lower Body Bathing: Supervision/safety Where Assessed-Lower Body Bathing: Bed level Upper Body Dressing: Independent Where Assessed-Upper Body Dressing: Edge of bed Lower Body Dressing: Supervision/safety Where Assessed-Lower Body Dressing: Bed level Toileting: Minimal assistance Where Assessed-Toileting: Bedside Commode (wide drop-arm BSC) Toilet Transfer: Close supervision Toilet Transfer Method: Theatre manager: Extra wide drop arm bedside commode Tub/Shower Transfer: Minimal assistance Tub/Shower Transfer Method: Administrator, arts: Radio broadcast assistant Vision Patient Visual Report: No change from baseline Perception  Perception: Within Functional Limits Praxis Praxis: Intact Cognition Overall Cognitive Status: Within Functional Limits for tasks assessed Arousal/Alertness: Awake/alert Orientation Level: Oriented X4 Focused Attention: Appears intact Memory:  Appears intact Sensation Sensation Light Touch: Appears Intact Coordination Gross Motor Movements are Fluid and Coordinated: Yes Fine Motor Movements are Fluid and Coordinated: Yes Motor  Motor Motor: Other (comment) Motor - Discharge Observations: critical careness  myopathy from prolonged hospitalization Mobility  Bed Mobility Bed Mobility: Rolling Right;Rolling Left;Sit to Supine;Supine to Sit Rolling Right: Independent with assistive device Rolling Left: Independent with assistive device Supine to Sit: Independent with assistive device Sit to Supine: Independent with assistive device Transfers Sit to Stand: Minimal Assistance - Patient > 75% Stand to Sit: Minimal Assistance - Patient > 75% (elevated mat table and BUE support on RW)  Trunk/Postural Assessment  Cervical Assessment Cervical Assessment: Within Functional Limits Thoracic Assessment Thoracic Assessment: Within Functional Limits Lumbar Assessment Lumbar Assessment: Within Functional Limits Postural Control Postural Control: Within Functional Limits  Balance Balance Balance Assessed: Yes Static Sitting Balance Static Sitting - Balance Support: Feet supported Static Sitting - Level of Assistance: 7: Independent Dynamic Sitting Balance Dynamic Sitting - Balance Support: Feet supported Dynamic Sitting - Level of Assistance: 7: Independent Extremity/Trunk Assessment RUE Assessment RUE Assessment: Within Functional Limits General Strength Comments: MMT grossly 4+/5 LUE Assessment LUE Assessment: Within Functional Limits General Strength Comments: MMT grossly 4+/5   Lawarence Meek R [facility] 12/27/2019, 12:42 PM

## 2019-12-28 ENCOUNTER — Inpatient Hospital Stay (HOSPITAL_COMMUNITY): Payer: Medicare Other | Admitting: Physical Therapy

## 2019-12-28 MED ORDER — CLONAZEPAM 0.25 MG PO TBDP: ORAL_TABLET | ORAL | 0 refills | Status: AC

## 2019-12-28 MED ORDER — ALBUTEROL SULFATE (2.5 MG/3ML) 0.083% IN NEBU: 3.0000 mL | INHALATION_SOLUTION | RESPIRATORY_TRACT | 12 refills | Status: AC | PRN

## 2019-12-28 MED ORDER — ALBUTEROL SULFATE HFA 108 (90 BASE) MCG/ACT IN AERS: 2.0000 | INHALATION_SPRAY | Freq: Four times a day (QID) | RESPIRATORY_TRACT | 2 refills | Status: AC | PRN

## 2019-12-28 MED FILL — ALBUTEROL SUL 2.5 MG/3 ML S: (2.5 MG/3ML | 5 days supply | Qty: 90 | Fill #0

## 2019-12-28 MED FILL — ALBUTEROL SULFATE HFA 108 (: 108 (90 BAS | 25 days supply | Qty: 18 | Fill #0

## 2019-12-28 MED FILL — clonazePAM 0.25 MG TBDP: 0.25 | 4 days supply | Qty: 21 | Fill #0

## 2019-12-28 NOTE — Progress Notes (Signed)
[facility] PHYSICAL MEDICINE & REHABILITATION PROGRESS NOTE   Feels good today , completed therapy , breathing well on 5L    ROS:    ROS- neg CP, SOB, N/V/D    Objective:   No results found. No results for input(s): WBC, HGB, HCT, PLT in the last 72 hours. No results for input(s): NA, K, CL, CO2, GLUCOSE, BUN, CREATININE, CALCIUM in the last 72 hours.  Intake/Output Summary (Last 24 hours) at 12/28/2019 0745 Last data filed at 12/27/2019 1820 Gross per 24 hour  Intake 1839 ml  Output --  Net 1839 ml     Physical Exam: Vital Signs Blood pressure 117/73, pulse 86, temperature 98.3 F (36.8 C), resp. rate 20, height 5\' 7"  (1.702 m), weight 103.4 kg, SpO2 95 %.   General: No acute distress Mood and affect are appropriate Heart: Regular rate and rhythm no rubs murmurs or extra sounds Lungs: Clear to auscultation, breathing unlabored, no rales or wheezes Abdomen: Positive bowel sounds, soft nontender to palpation, nondistended Extremities: No clubbing, cyanosis, or edem  Neurologic: Cranial nerves II through XII intact, motor strength is 5/5 in bilateral deltoid, bicep, tricep, grip, hip flexor, knee extensors, ankle dorsiflexor and plantar flexor 3/5 Hip ext  Musculoskeletal: Full range of motion in all 4 extremities. No joint swelling  Assessment/Plan: 1. Functional deficits secondary to CIM which require 3+ hours per day of interdisciplinary therapy in a comprehensive inpatient rehab setting.  Physiatrist is providing close team supervision and 24 hour management of active medical problems listed below.  Physiatrist and rehab team continue to assess barriers to discharge/monitor patient progress toward functional and medical goals  Care Tool:  Bathing    Body parts bathed by patient: Front perineal area, Face, Right arm, Left arm, Chest, Abdomen, Right upper leg, Left upper leg, Right lower leg, Left lower leg, Buttocks   Body parts bathed by helper: Front perineal  area, Buttocks, Right upper leg, Left upper leg, Right lower leg, Left lower leg Body parts n/a: Right arm, Left arm, Chest, Abdomen   Bathing assist Assist Level: Set up assist     Upper Body Dressing/Undressing Upper body dressing   What is the patient wearing?: Pull over shirt    Upper body assist Assist Level: Independent    Lower Body Dressing/Undressing Lower body dressing      What is the patient wearing?: Pants     Lower body assist Assist for lower body dressing: Supervision/Verbal cueing (Bed level)     Toileting Toileting    Toileting assist Assist for toileting: Minimal Assistance - Patient > 75%     Transfers Chair/bed transfer  Transfers assist     Chair/bed transfer assist level: Set up assist Chair/bed transfer assistive device: Sliding board   Locomotion Ambulation   Ambulation assist   Ambulation activity did not occur: Safety/medical concerns  Assist level: 2 helpers Assistive device: Parallel bars Max distance: 39ft   Walk 10 feet activity   Assist  Walk 10 feet activity did not occur: Safety/medical concerns  Assist level: 2 helpers Assistive device: Parallel bars   Walk 50 feet activity   Assist Walk 50 feet with 2 turns activity did not occur: Safety/medical concerns         Walk 150 feet activity   Assist Walk 150 feet activity did not occur: Safety/medical concerns         Walk 10 feet on uneven surface  activity   Assist Walk 10 feet on uneven surfaces activity  did not occur: Safety/medical concerns         Wheelchair     Assist Will patient use wheelchair at discharge?: Yes Type of Wheelchair: Manual    Wheelchair assist level: Independent Max wheelchair distance: 150    Wheelchair 50 feet with 2 turns activity    Assist        Assist Level: Independent   Wheelchair 150 feet activity     Assist      Assist Level: Independent   Blood pressure 117/73, pulse 86,  temperature 98.3 F (36.8 C), resp. rate 20, height 5\' 7"  (1.702 m), weight 103.4 kg, SpO2 95 %.  Medical Problem List and Plan: 1.  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  Continue prednisone with taper Currently 15mg  daily -  Discharge home today   2.  Antithrombotics: -DVT/anticoagulation: Bilateral lower extremity DVTs.  Patient will remain on Eliquis 5 mg twice daily for a minimum of 3 months total             -antiplatelet therapy: N/A 3. Pain Management: Hydrocodone 1 tablet every 6 hours as needed- pt said she took Percocet 10/325 mg QID at home- Added heat , Kpad. Well controlled.   9/4- pain well controlled except for sinus pressure- con't regimen 4. Mood: Clonazepam 0.5 mg 3 times daily, reduced to 0.25 mg 3 times daily, would reduce to twice daily for 1 week then once a day for 1 week after discharge to home Paxil 40 mg daily, melatonin 6 mg nightly- will see if possible to wean  -would benefit from neuropsych referral to help deal with situational anxiety             -antipsychotic agents: N/A  5. Neuropsych: This patient is capable of making decisions on her own behalf. 6. Skin/Wound Care: Routine skin checks 7. Fluids/Electrolytes/Nutrition: Routine in and outs with follow-up chemistries. Added Boost BID as per patient's request 8.  Hypothyroidism.  Continue Synthroid 25 mcg Monday Wednesday Friday 9.  Hypertension.  Lasix 40 mg daily, metoprolol 50 mg twice daily Vitals:   12/28/19 0538 12/28/19 0539  BP: 96/68 117/73  Pulse: 97 86  Resp: 20   Temp: 98.3 F (36.8 C)   SpO2: 95%   Well controlled 9/10 10.  Hyperlipidemia.  Lipitor 40 mg daily 11.  History of gastric bypass 2017.  Dietary follow-up 12.  History left TKA April 2021.  Weightbearing as tolerated- left knee actually hurts more than RIght  And needs TKR on R knee, per pt. 13. Grounds pass if portable O2 is available  14.  Post infectious cough on Dulera, on Prednisone taper. Will  slow down taper 15mg   x1 wk reduce by 5mg  per week, f/u pulmonary (pt has pulmonologist in [place])                               15.  Acute  sinusitis with max sinus tenderness maxillofacial CT of sinuses demonstrates some fluid in maxillary sinuses as well as post op changes, completed  Augmentin      LOS: 24 days A FACE TO FACE EVALUATION WAS PERFORMED  2018 12/28/2019, 7:45 AM

## 2019-12-28 NOTE — Progress Notes (Signed)
Patient discharged to home with significant other. Discharged by therapy.

## 2019-12-28 NOTE — Plan of Care (Signed)
  Problem: RH Furniture Transfers Goal: LTG Patient will perform furniture transfers w/assist (OT/PT) Description: LTG: Patient will perform furniture transfers  with assistance (OT/PT). Outcome: Completed/Met   Problem: RH Balance Goal: LTG Patient will maintain dynamic sitting balance (PT) Description: LTG:  Patient will maintain dynamic sitting balance with assistance during mobility activities (PT) Outcome: Completed/Met Goal: LTG Patient will maintain dynamic standing balance (PT) Description: LTG:  Patient will maintain dynamic standing balance with assistance during mobility activities (PT) Outcome: Completed/Met   Problem: Sit to Stand Goal: LTG:  Patient will perform sit to stand with assistance level (PT) Description: LTG:  Patient will perform sit to stand with assistance level (PT) Outcome: Completed/Met   Problem: RH Bed Mobility Goal: LTG Patient will perform bed mobility with assist (PT) Description: LTG: Patient will perform bed mobility with assistance, with/without cues (PT). Outcome: Completed/Met   Problem: RH Bed to Chair Transfers Goal: LTG Patient will perform bed/chair transfers w/assist (PT) Description: LTG: Patient will perform bed to chair transfers with assistance (PT). Outcome: Completed/Met   Problem: RH Car Transfers Goal: LTG Patient will perform car transfers with assist (PT) Description: LTG: Patient will perform car transfers with assistance (PT). Outcome: Completed/Met   Problem: RH Ambulation Goal: LTG Patient will ambulate in controlled environment (PT) Description: LTG: Patient will ambulate in a controlled environment, # of feet with assistance (PT). Outcome: Completed/Met   Problem: RH Wheelchair Mobility Goal: LTG Patient will propel w/c in controlled environment (PT) Description: LTG: Patient will propel wheelchair in controlled environment, # of feet with assist (PT) Outcome: Completed/Met Goal: LTG Patient will propel w/c in home  environment (PT) Description: LTG: Patient will propel wheelchair in home environment, # of feet with assistance (PT). Outcome: Completed/Met Goal: LTG Patient will propel w/c in community environment (PT) Description: LTG: Patient will propel wheelchair in community environment, # of feet with assist (PT) Outcome: Completed/Met

## 2019-12-28 NOTE — Progress Notes (Signed)
Inpatient Rehabilitation Care Coordinator  Discharge Note  The overall goal for the admission was met for:   Discharge location: Yes, Home  Length of Stay: Yes, 24 Days  Discharge activity level: Yes, supervision assistance   Home/community participation: Yes  Services provided included: MD, RD, PT, OT, SLP, RN, CM, TR, Pharmacy, [place]: Medicare  Follow-up services arranged: Home Health: [place] [facility] Special educational needs teacher known as: [facility])   Comments (or additional information): PT OT Aide  Patient/Family verbalized understanding of follow-up arrangements: Yes  Individual responsible for coordination of the follow-up plan: self, [phone]  Confirmed correct DME delivered: Dyanne Iha 12/28/2019    Dyanne Iha

## 2019-12-28 NOTE — Plan of Care (Signed)
  Problem: Consults Goal: RH GENERAL PATIENT EDUCATION Description: See Patient Education module for education specifics. Outcome: Completed/Met   Problem: RH BOWEL ELIMINATION Goal: RH STG MANAGE BOWEL WITH ASSISTANCE Description: STG Manage Bowel with sup Assistance. Outcome: Completed/Met   Problem: RH BLADDER ELIMINATION Goal: RH STG MANAGE BLADDER WITH ASSISTANCE Description: STG Manage Bladder With Supv Assistance Outcome: Completed/Met   Problem: RH SKIN INTEGRITY Goal: RH STG SKIN FREE OF INFECTION/BREAKDOWN Description: Skin will be free of infection/breakdown with min assist Outcome: Completed/Met Goal: RH STG MAINTAIN SKIN INTEGRITY WITH ASSISTANCE Description: STG Maintain Skin Integrity With min Assistance. Outcome: Completed/Met   Problem: RH SAFETY Goal: RH STG ADHERE TO SAFETY PRECAUTIONS W/ASSISTANCE/DEVICE Description: STG Adhere to Safety Precautions With MIN Assistance/Device. Outcome: Completed/Met   Problem: RH PAIN MANAGEMENT Goal: RH STG PAIN MANAGED AT OR BELOW PT'S PAIN GOAL Description: Pain will be managed at 3 out of 10 at MOD I  Outcome: Completed/Met   Problem: RH KNOWLEDGE DEFICIT GENERAL Goal: RH STG INCREASE KNOWLEDGE OF SELF CARE AFTER HOSPITALIZATION Description: Pt will be able to verbalize self care related to handouts, therapy, and personalization for independent care with min assist  Outcome: Completed/Met

## 2020-02-18 DEATH — deceased

## 2020-09-26 IMAGING — CR DG CHEST 2V
2 series · 2 of 2 positions shown · non-contrast
Comparison: 11/29/2019

CLINICAL DATA: Cough

EXAM:
CHEST - 2 VIEW

[chest lat]
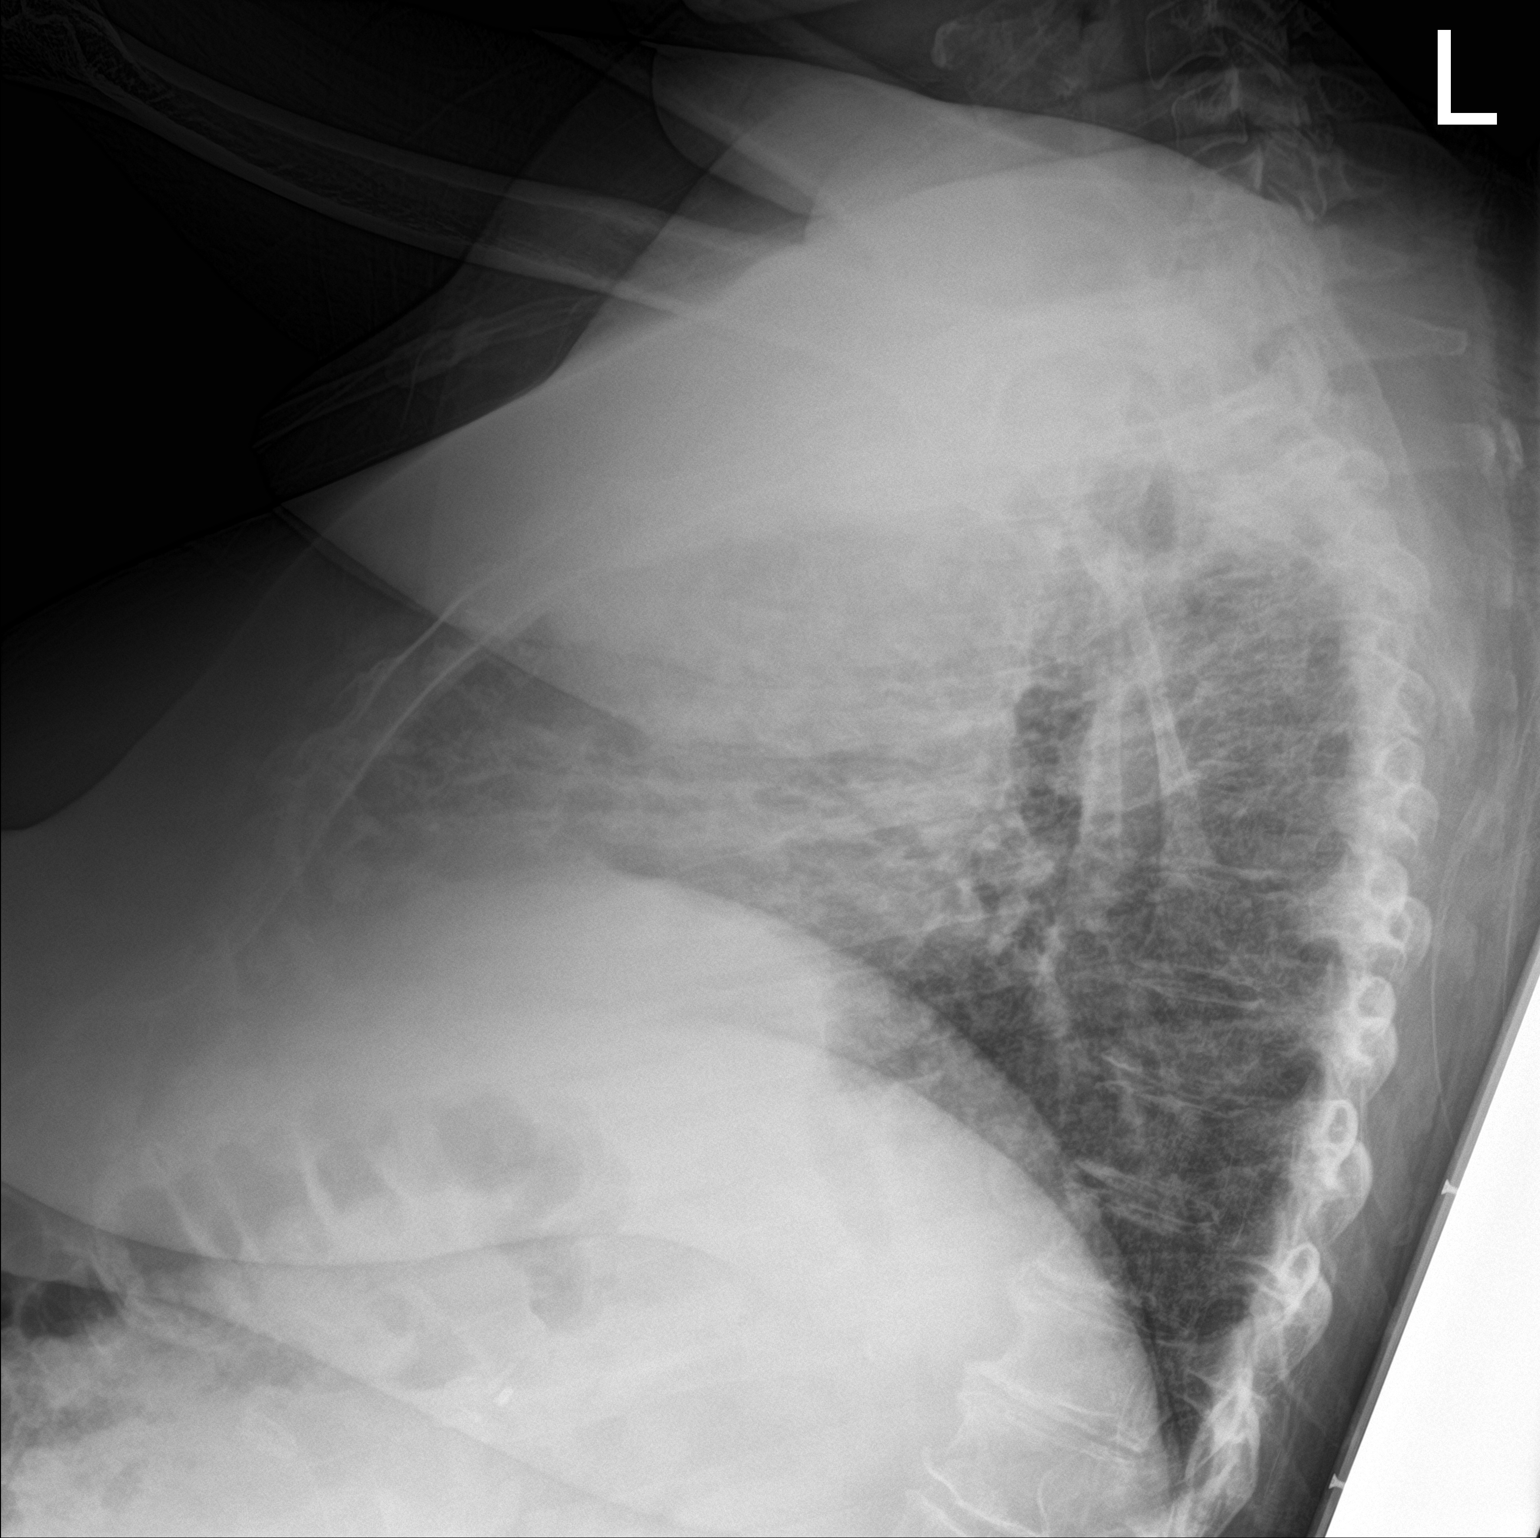

[chest ap]
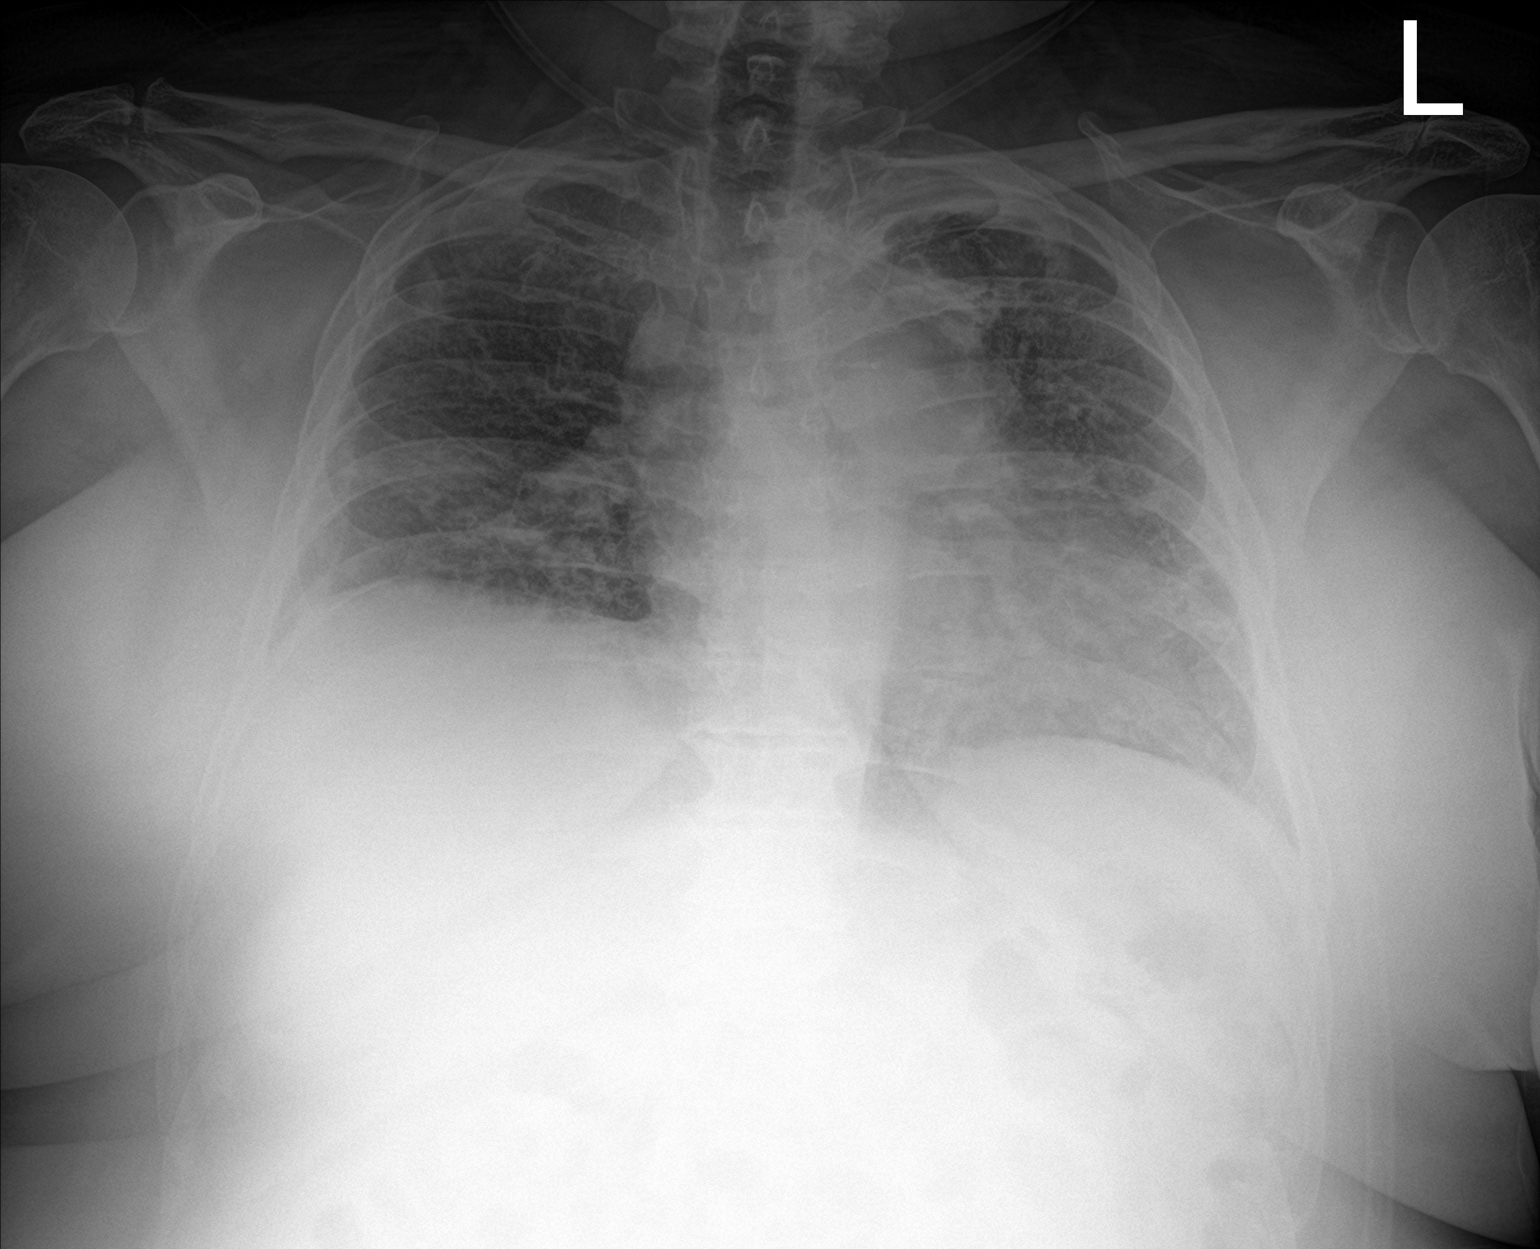

[2 of 2 positions shown; findings below may reference images not displayed]

FINDINGS: Low lung volumes.

Faint subpleural/peripheral pulmonary opacities in the lungs
bilaterally with increased interstitial prominence. This appearance
is chronic on prior studies and favors chronic interstitial lung
disease. A superimposed acute process is difficult to exclude but is
not favored.

No pleural effusion or pneumothorax.

The heart is top-normal in size.
IMPRESSION: Low lung volumes with suspected chronic interstitial lung disease.
Superimposed acute process is difficult to exclude but is not
favored.
